# Patient Record
Sex: Male | Born: 1960 | Race: White | Hispanic: No | Marital: Married | Smoking: Never smoker
Health system: Southern US, Community
[De-identification: ages and names within clinical notes are randomized; demographics above are authoritative.]

## PROBLEM LIST (undated history)

## (undated) DIAGNOSIS — F32A Depression, unspecified: Secondary | ICD-10-CM

## (undated) DIAGNOSIS — I10 Essential (primary) hypertension: Secondary | ICD-10-CM

## (undated) DIAGNOSIS — E785 Hyperlipidemia, unspecified: Secondary | ICD-10-CM

## (undated) DIAGNOSIS — R011 Cardiac murmur, unspecified: Secondary | ICD-10-CM

## (undated) DIAGNOSIS — F329 Major depressive disorder, single episode, unspecified: Secondary | ICD-10-CM

## (undated) HISTORY — DX: Depression, unspecified: F32.A

## (undated) HISTORY — DX: Major depressive disorder, single episode, unspecified: F32.9

## (undated) HISTORY — DX: Hyperlipidemia, unspecified: E78.5

## (undated) HISTORY — DX: Essential (primary) hypertension: I10

---

## 2010-11-10 ENCOUNTER — Ambulatory Visit: Payer: Self-pay | Admitting: Internal Medicine

## 2010-12-07 ENCOUNTER — Ambulatory Visit: Payer: Self-pay | Admitting: Internal Medicine

## 2011-02-13 ENCOUNTER — Ambulatory Visit: Payer: Self-pay | Admitting: Family Medicine

## 2012-03-12 IMAGING — CR DG CHEST 2V
1 series · 3 of 3 positions shown · non-contrast
Comparison: none

REASON FOR EXAM: cough, congestion,
COMMENTS:

PROCEDURE:     MDR - MDR CHEST PA(OR AP) AND LATERAL  - November 10, 2010  [DATE]
RESULT:     The lungs are clear. The heart and pulmonary vessels are normal.
The bony and mediastinal structures are unremarkable. There is no effusion.
There is no pneumothorax or evidence of congestive failure.

[Series 1: view not recorded · 0.17mm/px · 3 of 3 slices shown]
[im 1/3]
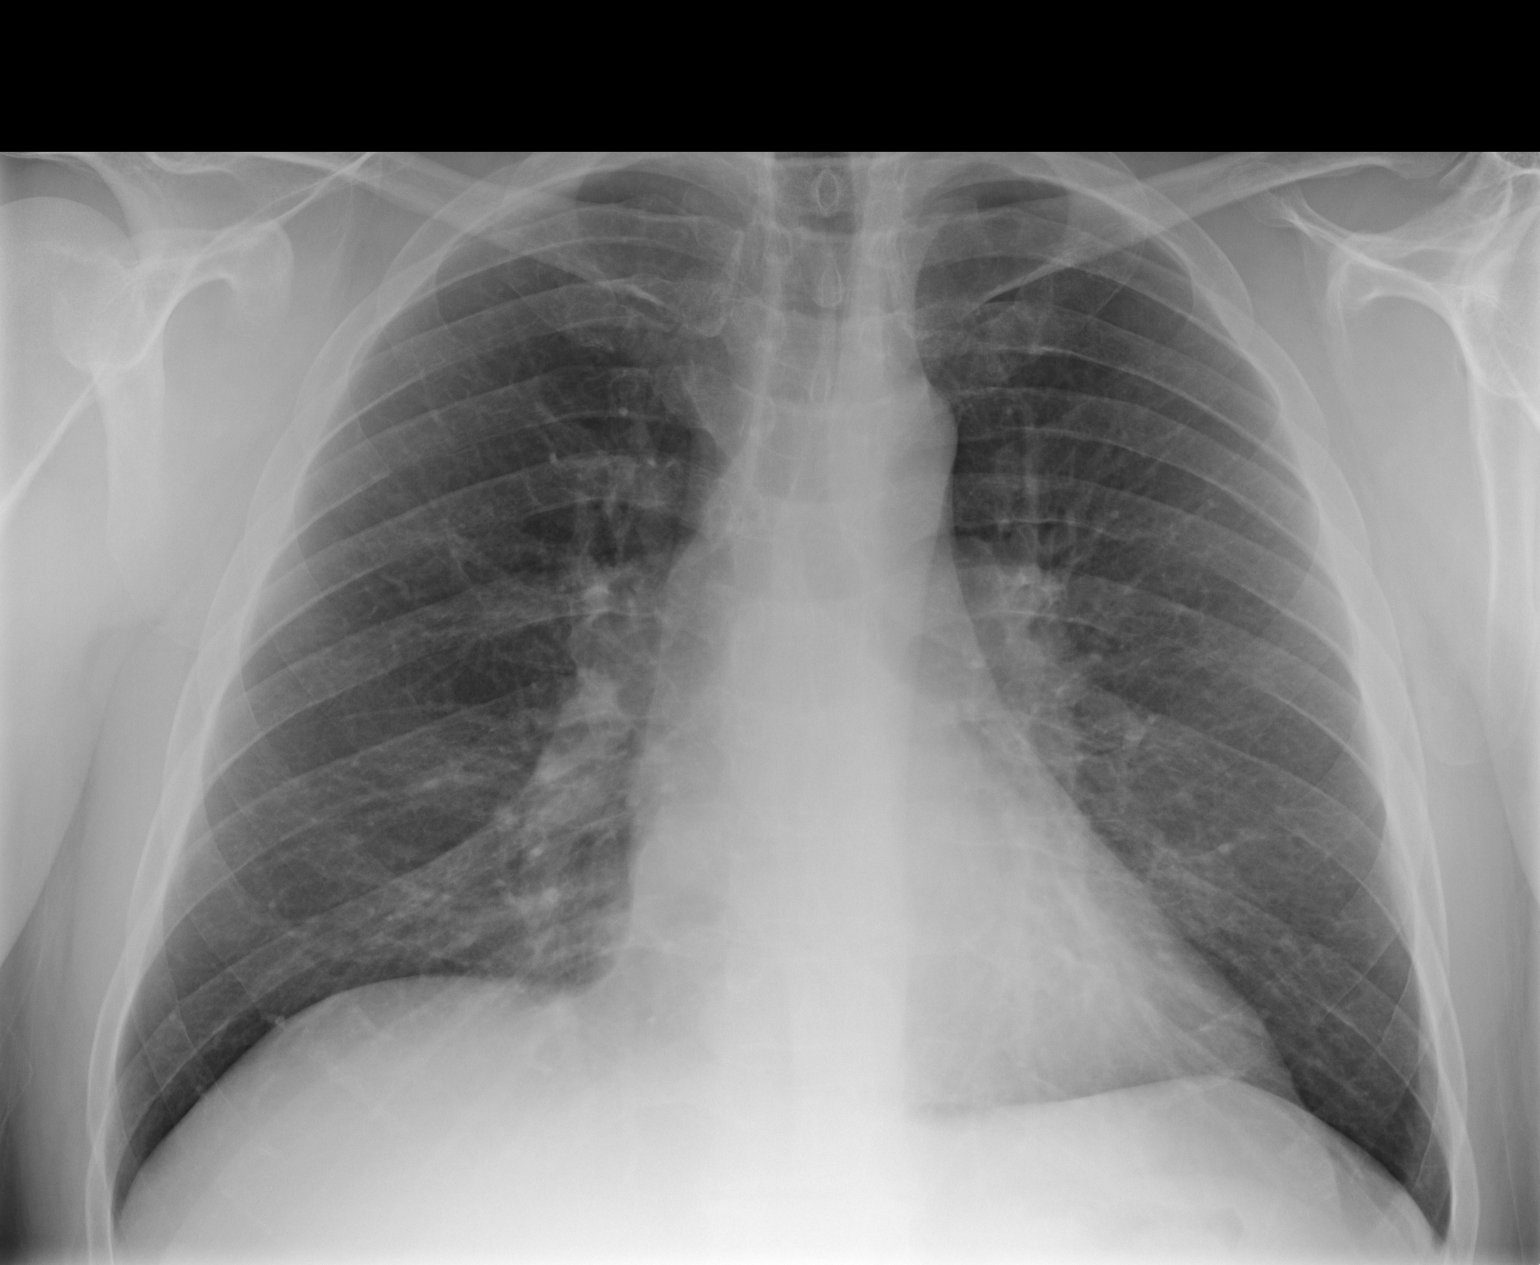
[im 2/3]
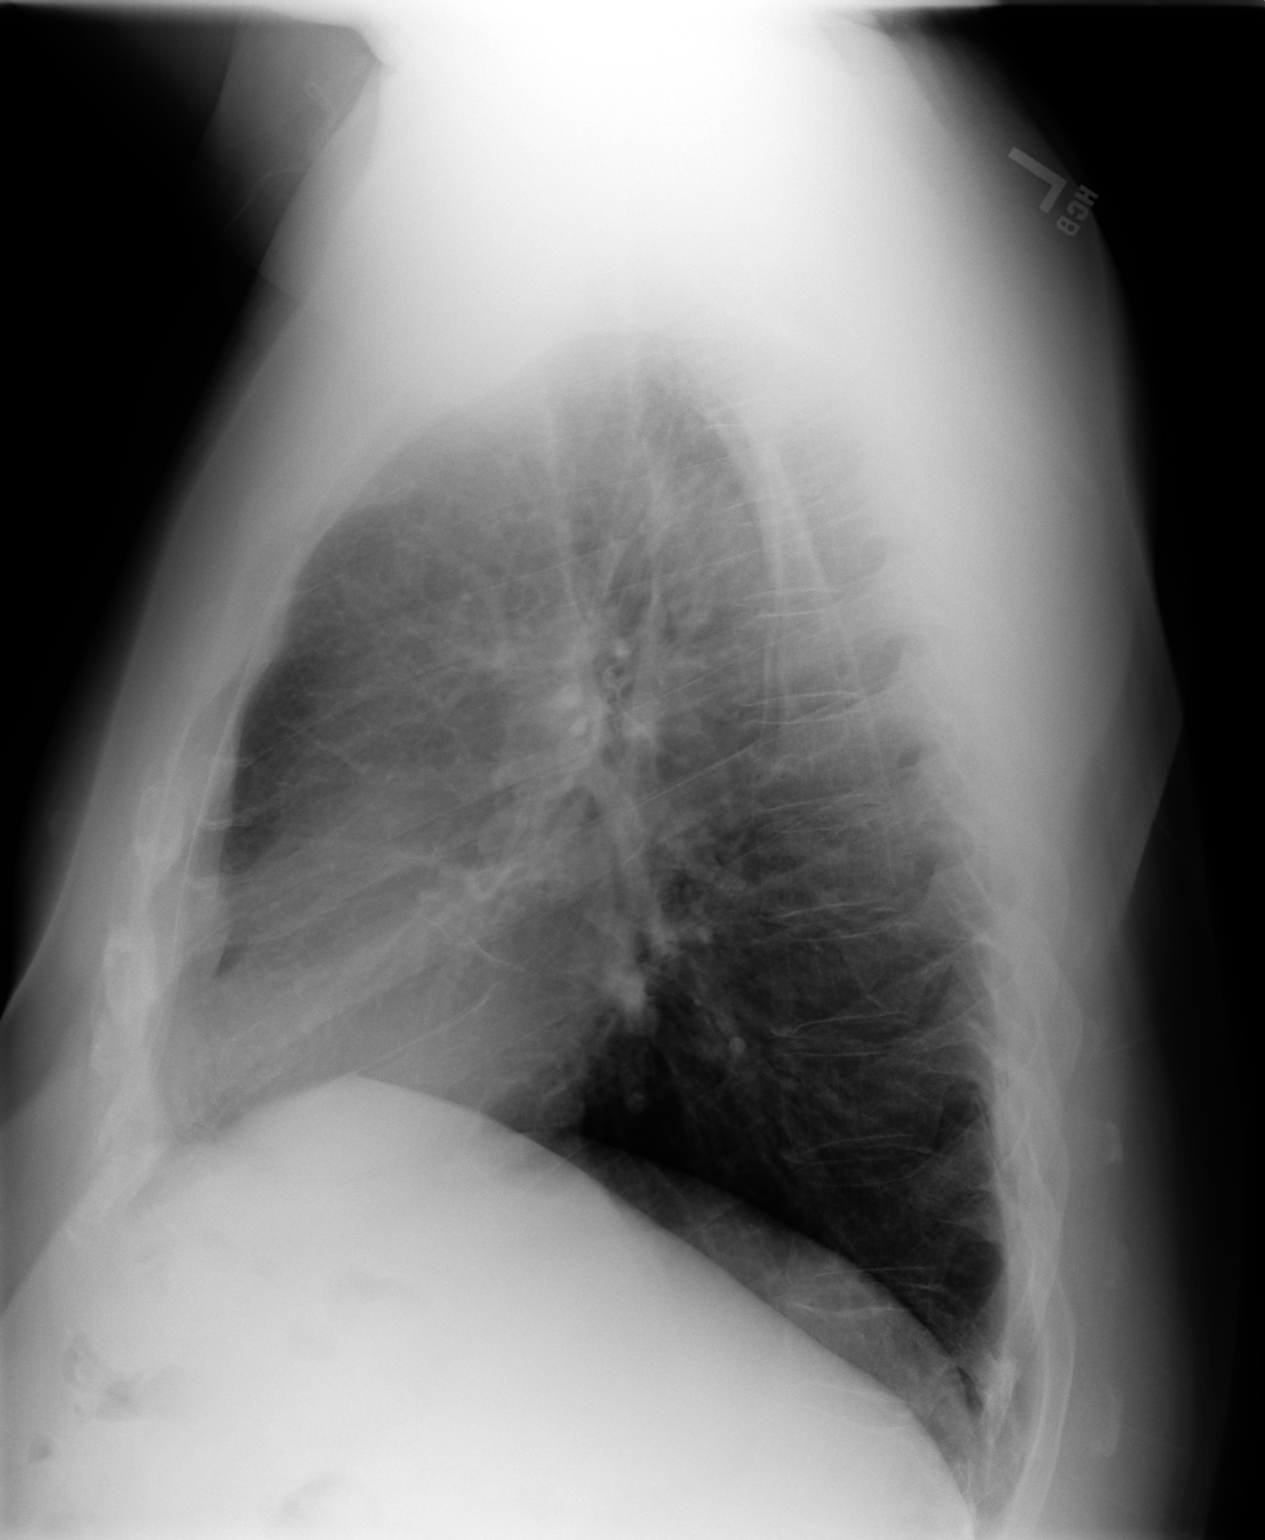
[im 3/3]
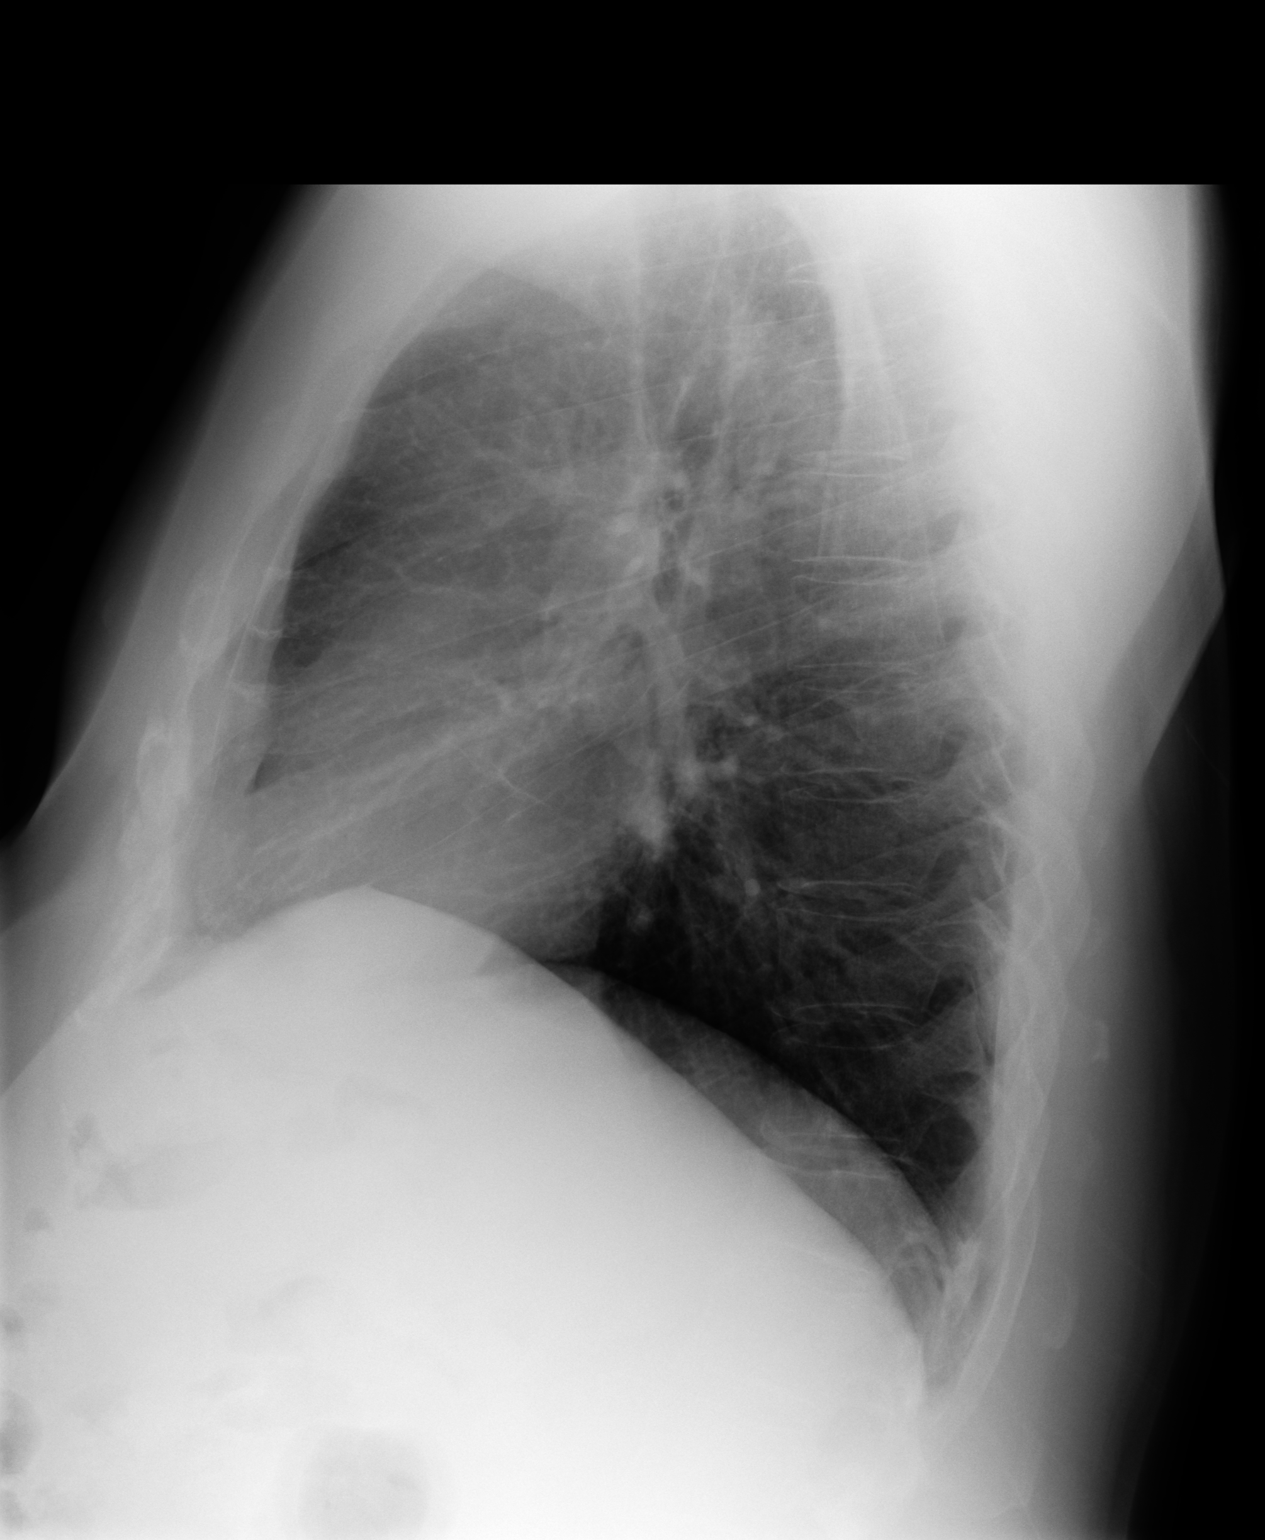

[3 of 3 positions shown; findings below may reference images not displayed]

IMPRESSION: No acute cardiopulmonary disease.

## 2012-03-15 ENCOUNTER — Ambulatory Visit: Payer: Self-pay

## 2012-03-15 LAB — OCCULT BLOOD X 1 CARD TO LAB, STOOL: Occult Blood, Feces: POSITIVE

## 2012-06-15 IMAGING — CR DG CHEST 2V
1 series · 2 of 2 positions shown · non-contrast
Comparison: none

REASON FOR EXAM: cough and wheezing
COMMENTS:   LMP: (Male)

[Series 1: view not recorded · 0.17mm/px · 2 of 2 slices shown]
[im 1/2]
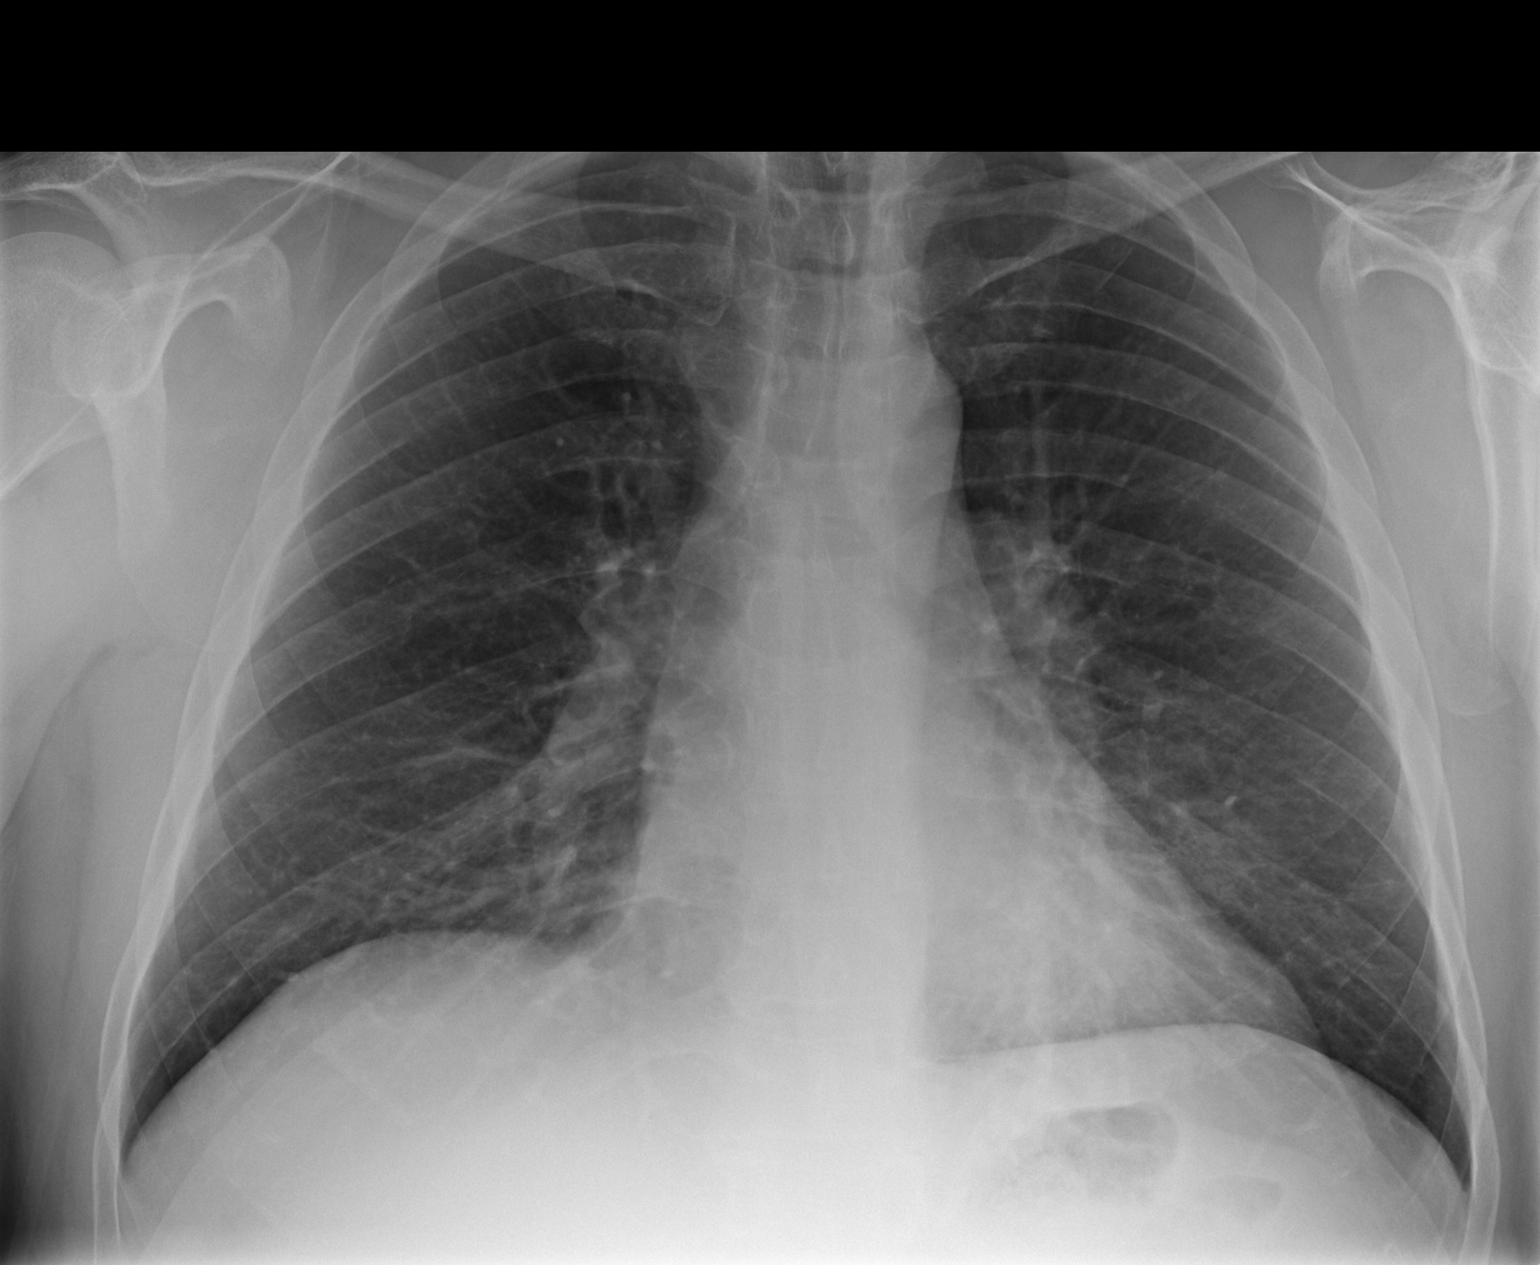
[im 2/2]
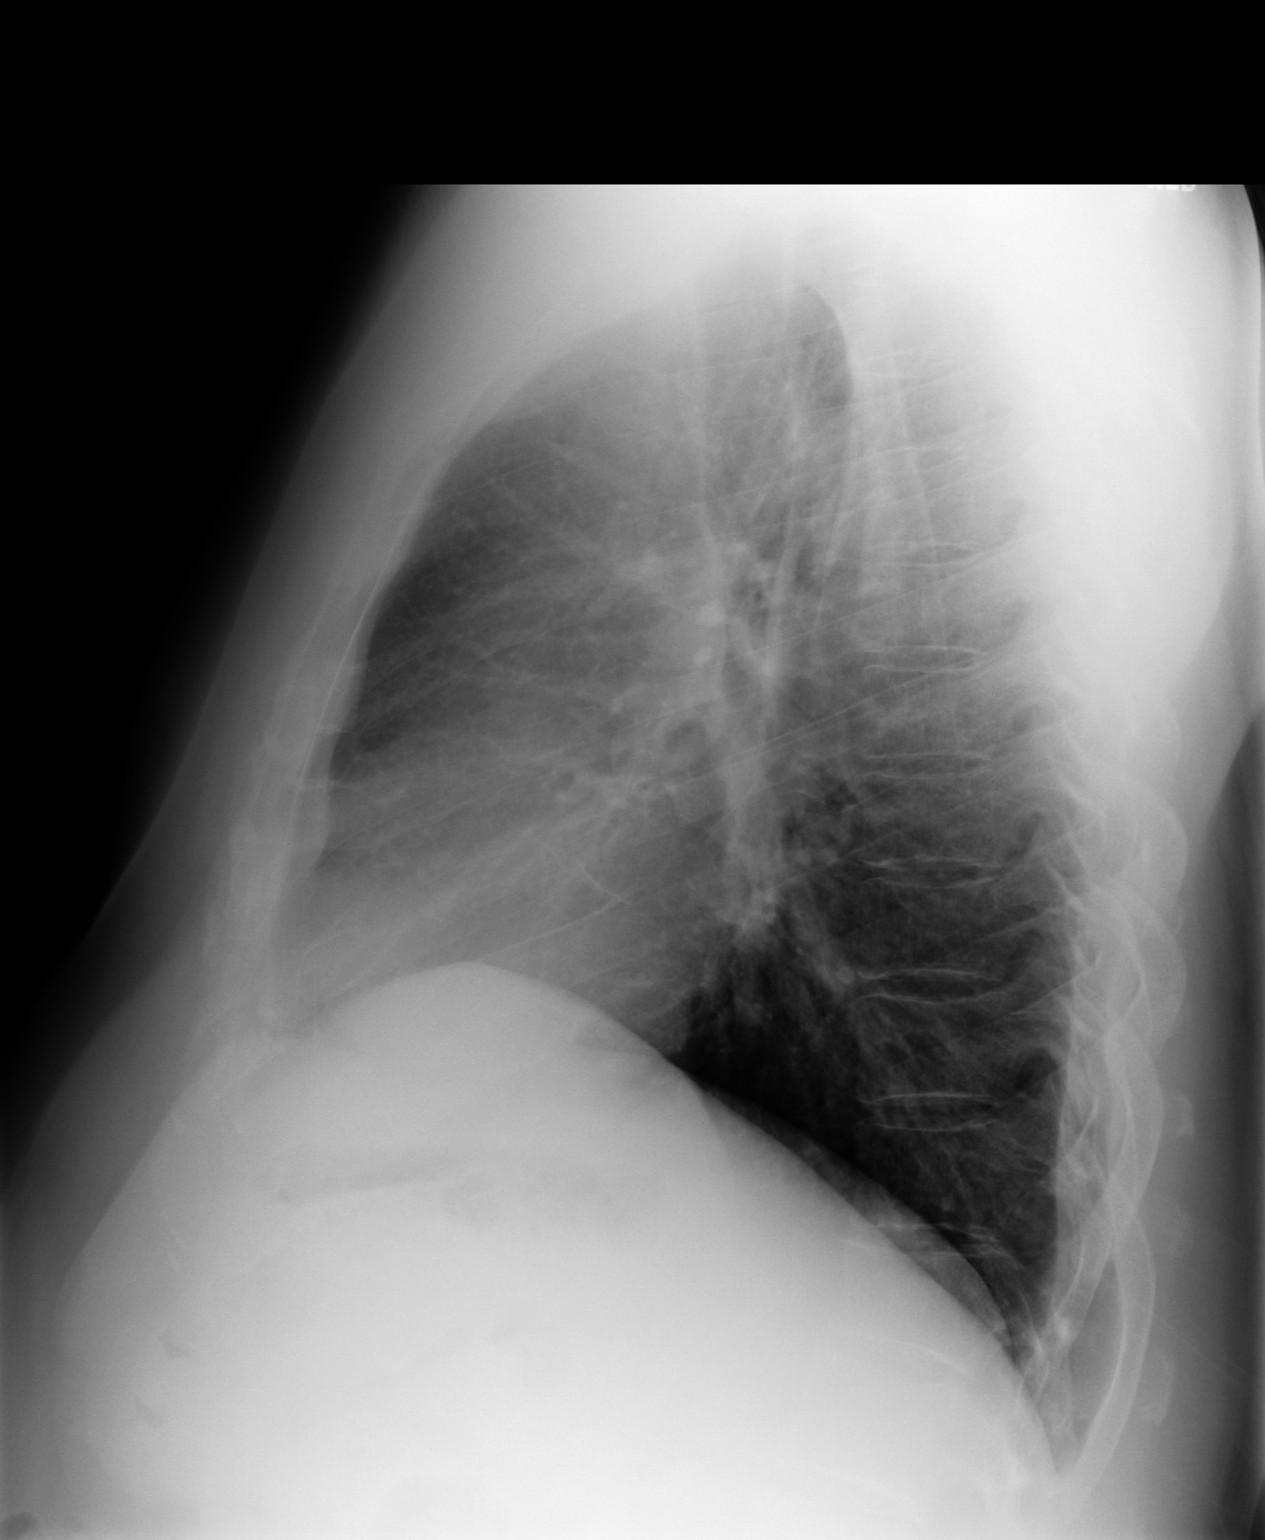

[2 of 2 positions shown; findings below may reference images not displayed]

PROCEDURE:     MDR - MDR CHEST PA(OR AP) AND LATERAL  - February 13, 2011  [DATE]

RESULT:     Comparison is made to the study of 11/10/2010.

The lungs are clear. The heart and pulmonary vessels are normal. The bony
and mediastinal structures are unremarkable. There is no effusion. There is
no pneumothorax or evidence of congestive failure.
IMPRESSION: No acute cardiopulmonary disease.

## 2013-03-10 ENCOUNTER — Ambulatory Visit: Payer: Self-pay

## 2013-03-10 LAB — DOT URINE DIP: Glucose,UR: NEGATIVE mg/dL (ref 0–75)

## 2013-09-19 ENCOUNTER — Ambulatory Visit: Payer: Self-pay | Admitting: Physician Assistant

## 2014-09-27 ENCOUNTER — Other Ambulatory Visit: Payer: Self-pay

## 2014-09-27 DIAGNOSIS — I10 Essential (primary) hypertension: Secondary | ICD-10-CM | POA: Insufficient documentation

## 2014-09-27 DIAGNOSIS — F419 Anxiety disorder, unspecified: Secondary | ICD-10-CM

## 2014-09-27 DIAGNOSIS — Z1211 Encounter for screening for malignant neoplasm of colon: Secondary | ICD-10-CM | POA: Insufficient documentation

## 2014-09-27 DIAGNOSIS — E785 Hyperlipidemia, unspecified: Secondary | ICD-10-CM | POA: Insufficient documentation

## 2014-09-27 DIAGNOSIS — F329 Major depressive disorder, single episode, unspecified: Secondary | ICD-10-CM | POA: Insufficient documentation

## 2014-09-28 ENCOUNTER — Ambulatory Visit (INDEPENDENT_AMBULATORY_CARE_PROVIDER_SITE_OTHER): Payer: BLUE CROSS/BLUE SHIELD | Admitting: Family Medicine

## 2014-09-28 ENCOUNTER — Encounter: Payer: Self-pay | Admitting: Family Medicine

## 2014-09-28 VITALS — BP 104/62 | HR 64 | Ht 73.0 in | Wt 286.0 lb

## 2014-09-28 DIAGNOSIS — I10 Essential (primary) hypertension: Secondary | ICD-10-CM

## 2014-09-28 DIAGNOSIS — E785 Hyperlipidemia, unspecified: Secondary | ICD-10-CM | POA: Diagnosis not present

## 2014-09-28 DIAGNOSIS — F329 Major depressive disorder, single episode, unspecified: Secondary | ICD-10-CM

## 2014-09-28 DIAGNOSIS — S46811A Strain of other muscles, fascia and tendons at shoulder and upper arm level, right arm, initial encounter: Secondary | ICD-10-CM

## 2014-09-28 DIAGNOSIS — F419 Anxiety disorder, unspecified: Secondary | ICD-10-CM

## 2014-09-28 DIAGNOSIS — F32A Depression, unspecified: Secondary | ICD-10-CM

## 2014-09-28 DIAGNOSIS — F418 Other specified anxiety disorders: Secondary | ICD-10-CM

## 2014-09-28 MED ORDER — ESCITALOPRAM OXALATE 20 MG PO TABS: 20.0000 mg | ORAL_TABLET | Freq: Every day | ORAL | Status: DC

## 2014-09-28 MED ORDER — LISINOPRIL 30 MG PO TABS: 30.0000 mg | ORAL_TABLET | Freq: Every day | ORAL | Status: DC

## 2014-09-28 MED ORDER — GEMFIBROZIL 600 MG PO TABS: 600.0000 mg | ORAL_TABLET | Freq: Two times a day (BID) | ORAL | Status: DC

## 2014-09-28 MED ORDER — HYDROCHLOROTHIAZIDE 25 MG PO TABS: 25.0000 mg | ORAL_TABLET | Freq: Every day | ORAL | Status: DC

## 2014-09-28 NOTE — Progress Notes (Signed)
Name: James Bishop   MRN: [id number]    DOB: October 07, 1960   Date:09/28/2014       Progress Note  Subjective  Chief Complaint  Chief Complaint  Patient presents with  . Depression  . Hypertension  . Hyperlipidemia  . Shoulder Pain    hurts to raise R) arm    Hypertension This is a chronic problem. The current episode started more than 1 year ago. The problem has been gradually improving since onset. The problem is controlled. Associated symptoms include headaches and PND. Pertinent negatives include no anxiety, blurred vision, chest pain, malaise/fatigue, neck pain, palpitations or shortness of breath. There are no associated agents to hypertension. There are no known risk factors for coronary artery disease. Past treatments include nothing. The current treatment provides moderate improvement. There are no compliance problems.  There is no history of angina, kidney disease, CAD/MI, CVA, heart failure, left ventricular hypertrophy, PVD or retinopathy. There is no history of chronic renal disease.  Hyperlipidemia This is a chronic problem. The current episode started more than 1 year ago. The problem is controlled. Recent lipid tests were reviewed and are normal. Exacerbating diseases include obesity. He has no history of chronic renal disease or diabetes. There are no known factors aggravating his hyperlipidemia. Pertinent negatives include no chest pain, focal sensory loss, focal weakness, leg pain, myalgias or shortness of breath. Current antihyperlipidemic treatment includes fibric acid derivatives. The current treatment provides mild improvement of lipids. There are no compliance problems.   Shoulder Pain  The pain is present in the right shoulder. This is a new problem. The current episode started more than 1 month ago. There has been no history of extremity trauma. The problem occurs daily. The problem has been gradually worsening. The quality of the pain is described as aching. The pain is  at a severity of 7/10. The pain is moderate. Pertinent negatives include no fever, joint locking or tingling. The symptoms are aggravated by activity. Family history does not include gout or rheumatoid arthritis. There is no history of diabetes.  Mental Health Problem The primary symptoms include dysphoric mood. The current episode started more than 1 month ago. This is a recurrent problem.  The degree of incapacity that he is experiencing as a consequence of his illness is mild. Additional symptoms of the illness include headaches. Additional symptoms of the illness do not include no anhedonia, no insomnia, no agitation, no euphoric mood or no abdominal pain. He does not admit to suicidal ideas. He does not have a plan to commit suicide. He does not contemplate harming himself. He does not contemplate injuring another person. He has not already  injured another person.    No problem-specific assessment & plan notes found for this encounter.   Past Medical History  Diagnosis Date  . Hyperlipidemia   . Hypertension   . Depression     History reviewed. No pertinent past surgical history.  Family History  Problem Relation Age of Onset  . Heart disease Maternal Grandmother   . Heart disease Maternal Grandfather     History   Social History  . Marital Status: Married    Spouse Name: N/A  . Number of Children: N/A  . Years of Education: N/A   Occupational History  . Not on file.   Social History Main Topics  . Smoking status: Former Research scientist (life sciences)  . Smokeless tobacco: Not on file  . Alcohol Use: 0.0 oz/week    0 Standard drinks or equivalent per  week  . Drug Use: No  . Sexual Activity: Yes   Other Topics Concern  . Not on file   Social History Narrative  . No narrative on file    Allergies  Allergen Reactions  . Bee Venom   . Penicillins      Review of Systems  Constitutional: Negative for fever, chills, weight loss and malaise/fatigue.  HENT: Negative for ear discharge,  ear pain and sore throat.   Eyes: Negative for blurred vision.  Respiratory: Negative for cough, sputum production, shortness of breath and wheezing.   Cardiovascular: Positive for PND. Negative for chest pain, palpitations and leg swelling.  Gastrointestinal: Negative for heartburn, nausea, abdominal pain, diarrhea, constipation, blood in stool and melena.  Genitourinary: Negative for dysuria, urgency, frequency and hematuria.  Musculoskeletal: Negative for myalgias, back pain, joint pain and neck pain.  Skin: Negative for rash.  Neurological: Positive for headaches. Negative for dizziness, tingling, sensory change and focal weakness.  Endo/Heme/Allergies: Negative for environmental allergies and polydipsia. Does not bruise/bleed easily.  Psychiatric/Behavioral: Positive for dysphoric mood. Negative for depression, suicidal ideas and agitation. The patient is not nervous/anxious and does not have insomnia.      Objective  Filed Vitals:   09/28/14 1055  BP: 104/62  Pulse: 64  Height: 6\' 1"  (1.854 m)  Weight: 286 lb (129.729 kg)    Physical Exam  Constitutional: He is oriented to person, place, and time and well-developed, well-nourished, and in no distress. No distress.  HENT:  Head: Normocephalic and atraumatic.  Right Ear: External ear normal.  Left Ear: External ear normal.  Nose: Nose normal.  Mouth/Throat: Oropharynx is clear and moist. No oropharyngeal exudate.  Eyes: Conjunctivae and EOM are normal. Pupils are equal, round, and reactive to light. Right eye exhibits no discharge. Left eye exhibits no discharge.  Neck: Normal range of motion. Neck supple. No thyromegaly present.  Cardiovascular: Normal rate, regular rhythm, normal heart sounds and intact distal pulses.   No murmur heard. Pulmonary/Chest: Effort normal and breath sounds normal. He has no wheezes. He has no rales. He exhibits no tenderness.  Abdominal: Soft. Bowel sounds are normal. There is no tenderness.   Musculoskeletal:       Right shoulder: He exhibits decreased range of motion and tenderness.  Lymphadenopathy:    He has no cervical adenopathy.  Neurological: He is alert and oriented to person, place, and time. He has normal reflexes. He displays normal reflexes. No cranial nerve deficit. He exhibits normal muscle tone.  Skin: Skin is warm and dry. He is not diaphoretic.  Psychiatric: Mood and affect normal.      No results found for this or any previous visit (from the past 2160 hour(s)).   Assessment & Plan  Problem List Items Addressed This Visit      Cardiovascular and Mediastinum   Hypertension - Primary   Relevant Medications   gemfibrozil (LOPID) 600 MG tablet   hydrochlorothiazide (HYDRODIURIL) 25 MG tablet   lisinopril (PRINIVIL,ZESTRIL) 30 MG tablet     Other   Anxiety and depression   Relevant Medications   escitalopram (LEXAPRO) 20 MG tablet   Dyslipidemia   Relevant Medications   gemfibrozil (LOPID) 600 MG tablet    Other Visit Diagnoses    Supraspinatus sprain, right, initial encounter        Relevant Orders    Ambulatory referral to Orthopedic Surgery         Dr. [facility]  09/28/2014    

## 2014-09-29 ENCOUNTER — Ambulatory Visit: Payer: Self-pay | Admitting: Family Medicine

## 2014-09-29 LAB — RENAL FUNCTION PANEL
Albumin: 4.6 g/dL (ref 3.5–5.5)
BUN/Creatinine Ratio: 17 (ref 9–20)
BUN: 15 mg/dL (ref 6–24)
CO2: 26 mmol/L (ref 18–29)
Calcium: 9.4 mg/dL (ref 8.7–10.2)
Chloride: 98 mmol/L (ref 97–108)
Creatinine, Ser: 0.86 mg/dL (ref 0.76–1.27)
GFR calc Af Amer: 115 mL/min/{1.73_m2} (ref >59)
GFR calc non Af Amer: 100 mL/min/{1.73_m2} (ref >59)
Glucose: 92 mg/dL (ref 65–99)
Phosphorus: 3.5 mg/dL (ref 2.5–4.5)
Potassium: 4.8 mmol/L (ref 3.5–5.2)
Sodium: 139 mmol/L (ref 134–144)

## 2014-09-29 LAB — LIPID PANEL
CHOLESTEROL TOTAL: 222 mg/dL — AB (ref 100–199)
Chol/HDL Ratio: 5.4 ratio units — ABNORMAL HIGH (ref 0.0–5.0)
HDL: 41 mg/dL (ref >39)
LDL Calculated: 132 mg/dL — ABNORMAL HIGH (ref 0–99)
TRIGLYCERIDES: 243 mg/dL — AB (ref 0–149)
VLDL Cholesterol Cal: 49 mg/dL — ABNORMAL HIGH (ref 5–40)

## 2014-11-12 ENCOUNTER — Other Ambulatory Visit: Payer: Self-pay | Admitting: Family Medicine

## 2014-11-12 DIAGNOSIS — F32A Depression, unspecified: Secondary | ICD-10-CM

## 2014-11-12 DIAGNOSIS — F329 Major depressive disorder, single episode, unspecified: Secondary | ICD-10-CM

## 2014-11-12 DIAGNOSIS — I10 Essential (primary) hypertension: Secondary | ICD-10-CM

## 2015-04-10 ENCOUNTER — Other Ambulatory Visit: Payer: Self-pay | Admitting: Family Medicine

## 2015-05-23 ENCOUNTER — Other Ambulatory Visit: Payer: Self-pay

## 2015-05-23 DIAGNOSIS — I10 Essential (primary) hypertension: Secondary | ICD-10-CM

## 2015-05-23 MED ORDER — HYDROCHLOROTHIAZIDE 25 MG PO TABS: 25.0000 mg | ORAL_TABLET | Freq: Every day | ORAL | Status: DC

## 2015-05-23 MED ORDER — LISINOPRIL 30 MG PO TABS: 30.0000 mg | ORAL_TABLET | Freq: Every day | ORAL | Status: DC

## 2015-05-23 MED ORDER — GEMFIBROZIL 600 MG PO TABS: 600.0000 mg | ORAL_TABLET | Freq: Two times a day (BID) | ORAL | Status: DC

## 2015-05-25 ENCOUNTER — Ambulatory Visit: Payer: BLUE CROSS/BLUE SHIELD | Admitting: Family Medicine

## 2015-06-08 ENCOUNTER — Encounter: Payer: Self-pay | Admitting: Family Medicine

## 2015-06-08 ENCOUNTER — Ambulatory Visit (INDEPENDENT_AMBULATORY_CARE_PROVIDER_SITE_OTHER): Payer: BLUE CROSS/BLUE SHIELD | Admitting: Family Medicine

## 2015-06-08 VITALS — BP 120/80 | HR 78 | Ht 73.0 in | Wt 280.0 lb

## 2015-06-08 DIAGNOSIS — F418 Other specified anxiety disorders: Secondary | ICD-10-CM

## 2015-06-08 DIAGNOSIS — Z23 Encounter for immunization: Secondary | ICD-10-CM | POA: Diagnosis not present

## 2015-06-08 DIAGNOSIS — E785 Hyperlipidemia, unspecified: Secondary | ICD-10-CM

## 2015-06-08 DIAGNOSIS — F32A Depression, unspecified: Secondary | ICD-10-CM

## 2015-06-08 DIAGNOSIS — F419 Anxiety disorder, unspecified: Secondary | ICD-10-CM

## 2015-06-08 DIAGNOSIS — I10 Essential (primary) hypertension: Secondary | ICD-10-CM | POA: Diagnosis not present

## 2015-06-08 DIAGNOSIS — F329 Major depressive disorder, single episode, unspecified: Secondary | ICD-10-CM

## 2015-06-08 MED ORDER — ESCITALOPRAM OXALATE 20 MG PO TABS: 20.0000 mg | ORAL_TABLET | Freq: Every day | ORAL | Status: DC

## 2015-06-08 MED ORDER — ESCITALOPRAM OXALATE 10 MG PO TABS: 10.0000 mg | ORAL_TABLET | Freq: Every day | ORAL | Status: DC

## 2015-06-08 MED ORDER — HYDROCHLOROTHIAZIDE 25 MG PO TABS: 25.0000 mg | ORAL_TABLET | Freq: Every day | ORAL | Status: DC

## 2015-06-08 MED ORDER — LISINOPRIL 30 MG PO TABS: 30.0000 mg | ORAL_TABLET | Freq: Every day | ORAL | Status: DC

## 2015-06-08 MED ORDER — GEMFIBROZIL 600 MG PO TABS: 600.0000 mg | ORAL_TABLET | Freq: Two times a day (BID) | ORAL | Status: DC

## 2015-06-08 NOTE — Progress Notes (Signed)
Name: James Bishop   MRN: [id number]    DOB: 12-13-1960   Date:06/08/2015       Progress Note  Subjective  Chief Complaint  Chief Complaint  Patient presents with  . Hypertension  . Hyperlipidemia  . Depression    discuss med    Hypertension This is a chronic problem. The current episode started more than 1 year ago. The problem has been gradually improving since onset. The problem is controlled. Pertinent negatives include no anxiety, blurred vision, chest pain, headaches, malaise/fatigue, neck pain, orthopnea, palpitations, peripheral edema, PND, shortness of breath or sweats. There are no associated agents to hypertension. There are no known risk factors for coronary artery disease. Past treatments include ACE inhibitors and diuretics. The current treatment provides moderate improvement. There are no compliance problems.  There is no history of angina, kidney disease, CAD/MI, CVA, heart failure, left ventricular hypertrophy, PVD, renovascular disease or retinopathy. There is no history of chronic renal disease or a hypertension causing med.  Hyperlipidemia This is a chronic problem. The current episode started more than 1 year ago. The problem is controlled. Recent lipid tests were reviewed and are normal. He has no history of chronic renal disease, diabetes, hypothyroidism, liver disease, obesity or nephrotic syndrome. Pertinent negatives include no chest pain, focal weakness, myalgias or shortness of breath. Current antihyperlipidemic treatment includes statins. The current treatment provides moderate improvement of lipids. There are no compliance problems.   Depression        This is a chronic problem.  The current episode started more than 1 year ago.   The onset quality is gradual.   Associated symptoms include no decreased concentration, no fatigue, no helplessness, no hopelessness, does not have insomnia, not irritable, no restlessness, no decreased interest, no appetite change, no  body aches, no myalgias, no headaches, no indigestion, not sad and no suicidal ideas.  Past treatments include SSRIs - Selective serotonin reuptake inhibitors.  Compliance with treatment is good.   Pertinent negatives include no hypothyroidism and no anxiety.   No problem-specific assessment & plan notes found for this encounter.   Past Medical History  Diagnosis Date  . Hyperlipidemia   . Hypertension   . Depression     History reviewed. No pertinent past surgical history.  Family History  Problem Relation Age of Onset  . Heart disease Maternal Grandmother   . Heart disease Maternal Grandfather     Social History   Social History  . Marital Status: Married    Spouse Name: N/A  . Number of Children: N/A  . Years of Education: N/A   Occupational History  . Not on file.   Social History Main Topics  . Smoking status: Former Research scientist (life sciences)  . Smokeless tobacco: Not on file  . Alcohol Use: 0.0 oz/week    0 Standard drinks or equivalent per week  . Drug Use: No  . Sexual Activity: Yes   Other Topics Concern  . Not on file   Social History Narrative    Allergies  Allergen Reactions  . Bee Venom   . Penicillins      Review of Systems  Constitutional: Negative for fever, chills, weight loss, malaise/fatigue, appetite change and fatigue.  HENT: Negative for ear discharge, ear pain and sore throat.   Eyes: Negative for blurred vision.  Respiratory: Negative for cough, sputum production, shortness of breath and wheezing.   Cardiovascular: Negative for chest pain, palpitations, orthopnea, leg swelling and PND.  Gastrointestinal: Negative for heartburn, nausea,  abdominal pain, diarrhea, constipation, blood in stool and melena.  Genitourinary: Negative for dysuria, urgency, frequency and hematuria.  Musculoskeletal: Negative for myalgias, back pain, joint pain and neck pain.  Skin: Negative for rash.  Neurological: Negative for dizziness, tingling, sensory change, focal  weakness and headaches.  Endo/Heme/Allergies: Negative for environmental allergies and polydipsia. Does not bruise/bleed easily.  Psychiatric/Behavioral: Positive for depression. Negative for suicidal ideas and decreased concentration. The patient is not nervous/anxious and does not have insomnia.      Objective  Filed Vitals:   06/08/15 1344  BP: 120/80  Pulse: 78  Height: 6\' 1"  (1.854 m)  Weight: 280 lb (127.007 kg)    Physical Exam  Constitutional: He is oriented to person, place, and time and well-developed, well-nourished, and in no distress. He is not irritable.  HENT:  Head: Normocephalic.  Right Ear: External ear normal.  Left Ear: External ear normal.  Nose: Nose normal.  Mouth/Throat: Oropharynx is clear and moist.  Eyes: Conjunctivae and EOM are normal. Pupils are equal, round, and reactive to light. Right eye exhibits no discharge. Left eye exhibits no discharge. No scleral icterus.  Neck: Normal range of motion. Neck supple. No JVD present. No tracheal deviation present. No thyromegaly present.  Cardiovascular: Normal rate, regular rhythm, normal heart sounds and intact distal pulses.  Exam reveals no gallop and no friction rub.   No murmur heard. Pulmonary/Chest: Breath sounds normal. No respiratory distress. He has no wheezes. He has no rales.  Abdominal: Soft. Bowel sounds are normal. He exhibits no mass. There is no hepatosplenomegaly. There is no tenderness. There is no rebound, no guarding and no CVA tenderness.  Musculoskeletal: Normal range of motion. He exhibits no edema or tenderness.  Lymphadenopathy:    He has no cervical adenopathy.  Neurological: He is alert and oriented to person, place, and time. He has normal sensation, normal strength, normal reflexes and intact cranial nerves. No cranial nerve deficit.  Skin: Skin is warm. No rash noted.  Psychiatric: Mood and affect normal.      Assessment & Plan  Problem List Items Addressed This Visit       Cardiovascular and Mediastinum   Hypertension - Primary   Relevant Medications   gemfibrozil (LOPID) 600 MG tablet   hydrochlorothiazide (HYDRODIURIL) 25 MG tablet   lisinopril (PRINIVIL,ZESTRIL) 30 MG tablet   Other Relevant Orders   Renal Function Panel     Other   Anxiety and depression   Relevant Medications   escitalopram (LEXAPRO) 20 MG tablet   escitalopram (LEXAPRO) 10 MG tablet   Dyslipidemia   Relevant Medications   gemfibrozil (LOPID) 600 MG tablet   Other Relevant Orders   Lipid Profile    Other Visit Diagnoses    Depression        if scored take one half with 20 mg    Relevant Medications    escitalopram (LEXAPRO) 20 MG tablet    escitalopram (LEXAPRO) 10 MG tablet    Need for Tdap vaccination        Relevant Orders    Tdap vaccine greater than or equal to 7yo IM (Completed)         Dr. Erick [facility]  06/08/2015

## 2015-06-09 LAB — RENAL FUNCTION PANEL
Albumin: 4.5 g/dL (ref 3.5–5.5)
BUN/Creatinine Ratio: 15 (ref 9–20)
BUN: 13 mg/dL (ref 6–24)
CALCIUM: 9 mg/dL (ref 8.7–10.2)
CHLORIDE: 100 mmol/L (ref 96–106)
CO2: 23 mmol/L (ref 18–29)
CREATININE: 0.86 mg/dL (ref 0.76–1.27)
GFR calc Af Amer: 114 mL/min/{1.73_m2} (ref >59)
GFR calc non Af Amer: 99 mL/min/{1.73_m2} (ref >59)
Glucose: 71 mg/dL (ref 65–99)
PHOSPHORUS: 3.1 mg/dL (ref 2.5–4.5)
Potassium: 4.1 mmol/L (ref 3.5–5.2)
SODIUM: 140 mmol/L (ref 134–144)

## 2015-06-09 LAB — LIPID PANEL
CHOLESTEROL TOTAL: 200 mg/dL — AB (ref 100–199)
Chol/HDL Ratio: 5 ratio units (ref 0.0–5.0)
HDL: 40 mg/dL (ref >39)
LDL CALC: 130 mg/dL — AB (ref 0–99)
TRIGLYCERIDES: 152 mg/dL — AB (ref 0–149)
VLDL Cholesterol Cal: 30 mg/dL (ref 5–40)

## 2015-06-22 ENCOUNTER — Other Ambulatory Visit: Payer: Self-pay | Admitting: Family Medicine

## 2015-07-06 ENCOUNTER — Ambulatory Visit: Payer: BLUE CROSS/BLUE SHIELD | Admitting: Family Medicine

## 2015-08-04 ENCOUNTER — Other Ambulatory Visit: Payer: Self-pay | Admitting: Family Medicine

## 2015-09-06 ENCOUNTER — Other Ambulatory Visit: Payer: Self-pay

## 2015-09-24 ENCOUNTER — Ambulatory Visit
Admission: EM | Admit: 2015-09-24 | Discharge: 2015-09-24 | Disposition: A | Discharge status: Home / Self Care | Payer: Self-pay

## 2015-10-07 ENCOUNTER — Other Ambulatory Visit: Payer: Self-pay | Admitting: Family Medicine

## 2015-12-06 ENCOUNTER — Other Ambulatory Visit: Payer: Self-pay

## 2015-12-18 ENCOUNTER — Other Ambulatory Visit: Payer: Self-pay

## 2015-12-18 DIAGNOSIS — E785 Hyperlipidemia, unspecified: Secondary | ICD-10-CM

## 2015-12-18 MED ORDER — GEMFIBROZIL 600 MG PO TABS: 600.0000 mg | ORAL_TABLET | Freq: Two times a day (BID) | ORAL | 0 refills | Status: DC

## 2016-01-07 ENCOUNTER — Encounter: Payer: Self-pay | Admitting: Family Medicine

## 2016-01-07 ENCOUNTER — Ambulatory Visit (INDEPENDENT_AMBULATORY_CARE_PROVIDER_SITE_OTHER): Payer: Self-pay | Admitting: Family Medicine

## 2016-01-07 VITALS — BP 130/80 | HR 80 | Ht 73.0 in | Wt 292.0 lb

## 2016-01-07 DIAGNOSIS — E669 Obesity, unspecified: Secondary | ICD-10-CM

## 2016-01-07 DIAGNOSIS — F419 Anxiety disorder, unspecified: Secondary | ICD-10-CM

## 2016-01-07 DIAGNOSIS — F418 Other specified anxiety disorders: Secondary | ICD-10-CM

## 2016-01-07 DIAGNOSIS — I10 Essential (primary) hypertension: Secondary | ICD-10-CM

## 2016-01-07 DIAGNOSIS — F329 Major depressive disorder, single episode, unspecified: Secondary | ICD-10-CM

## 2016-01-07 DIAGNOSIS — E785 Hyperlipidemia, unspecified: Secondary | ICD-10-CM

## 2016-01-07 DIAGNOSIS — F32A Depression, unspecified: Secondary | ICD-10-CM

## 2016-01-07 MED ORDER — ESCITALOPRAM OXALATE 20 MG PO TABS: 20.0000 mg | ORAL_TABLET | Freq: Every day | ORAL | 5 refills | Status: DC

## 2016-01-07 MED ORDER — HYDROCHLOROTHIAZIDE 25 MG PO TABS: 25.0000 mg | ORAL_TABLET | Freq: Every day | ORAL | 5 refills | Status: DC

## 2016-01-07 MED ORDER — GEMFIBROZIL 600 MG PO TABS: 600.0000 mg | ORAL_TABLET | Freq: Two times a day (BID) | ORAL | 5 refills | Status: DC

## 2016-01-07 MED ORDER — LISINOPRIL 30 MG PO TABS: 30.0000 mg | ORAL_TABLET | Freq: Every day | ORAL | 5 refills | Status: DC

## 2016-01-07 NOTE — Patient Instructions (Signed)

## 2016-01-07 NOTE — Progress Notes (Signed)
Name: James Bishop   MRN: [id number]    DOB: December 05, 1960   Date:01/07/2016       Progress Note  Subjective  Chief Complaint  Chief Complaint  Patient presents with  . Hyperlipidemia  . Hypertension  . Depression    Hypertension  This is a recurrent problem. The current episode started more than 1 year ago. The problem has been gradually improving since onset. The problem is controlled. Pertinent negatives include no anxiety, blurred vision, chest pain, headaches, malaise/fatigue, neck pain, orthopnea, palpitations, peripheral edema, PND, shortness of breath or sweats. There are no associated agents to hypertension. There are no known risk factors for coronary artery disease. Past treatments include ACE inhibitors and diuretics. The current treatment provides mild improvement. There are no compliance problems.  There is no history of angina, kidney disease, CAD/MI, CVA, heart failure, left ventricular hypertrophy, PVD, renovascular disease or retinopathy. There is no history of chronic renal disease or a hypertension causing med.  Hyperlipidemia  This is a chronic problem. The problem is controlled. Recent lipid tests were reviewed and are normal. He has no history of chronic renal disease. There are no known factors aggravating his hyperlipidemia. Pertinent negatives include no chest pain, focal sensory loss, focal weakness, leg pain, myalgias or shortness of breath. Current antihyperlipidemic treatment includes statins. The current treatment provides mild improvement of lipids. There are no compliance problems.  Risk factors for coronary artery disease include male sex, hypertension, dyslipidemia and obesity.  Depression       The patient presents with depression.  This is a recurrent problem.  The current episode started more than 1 year ago.   The onset quality is gradual.   The problem occurs intermittently.  The problem has been waxing and waning since onset.  Associated symptoms include  sad.  Associated symptoms include no decreased concentration, no fatigue, no helplessness, no hopelessness, does not have insomnia, not irritable, no restlessness, no decreased interest, no appetite change, no body aches, no myalgias, no headaches, no indigestion and no suicidal ideas.     The symptoms are aggravated by medication.  Past treatments include SSRIs - Selective serotonin reuptake inhibitors.  Compliance with treatment is good.  Previous treatment provided mild relief.  Past medical history includes depression.     Pertinent negatives include no anxiety.   No problem-specific Assessment & Plan notes found for this encounter.   Past Medical History:  Diagnosis Date  . Depression   . Hyperlipidemia   . Hypertension     History reviewed. No pertinent surgical history.  Family History  Problem Relation Age of Onset  . Heart disease Maternal Grandmother   . Heart disease Maternal Grandfather     Social History   Social History  . Marital status: Married    Spouse name: N/A  . Number of children: N/A  . Years of education: N/A   Occupational History  . Not on file.   Social History Main Topics  . Smoking status: Former Research scientist (life sciences)  . Smokeless tobacco: Not on file  . Alcohol use 0.0 oz/week  . Drug use: No  . Sexual activity: Yes   Other Topics Concern  . Not on file   Social History Narrative  . No narrative on file    Allergies  Allergen Reactions  . Bee Venom   . Penicillins      Review of Systems  Constitutional: Negative for appetite change, chills, fatigue, fever, malaise/fatigue and weight loss.  HENT: Negative for  ear discharge, ear pain and sore throat.   Eyes: Negative for blurred vision.  Respiratory: Negative for cough, sputum production, shortness of breath and wheezing.   Cardiovascular: Negative for chest pain, palpitations, orthopnea, leg swelling and PND.  Gastrointestinal: Negative for abdominal pain, blood in stool, constipation,  diarrhea, heartburn, melena and nausea.  Genitourinary: Negative for dysuria, frequency, hematuria and urgency.  Musculoskeletal: Negative for back pain, joint pain, myalgias and neck pain.  Skin: Negative for rash.  Neurological: Negative for dizziness, tingling, sensory change, focal weakness and headaches.  Endo/Heme/Allergies: Negative for environmental allergies and polydipsia. Does not bruise/bleed easily.  Psychiatric/Behavioral: Positive for depression. Negative for decreased concentration and suicidal ideas. The patient is not nervous/anxious and does not have insomnia.      Objective  Vitals:   01/07/16 0823  BP: 130/80  Pulse: 80  Weight: 292 lb (132.5 kg)  Height: 6\' 1"  (1.854 m)    Physical Exam  Constitutional: He is oriented to person, place, and time and well-developed, well-nourished, and in no distress. He is not irritable.  HENT:  Head: Normocephalic.  Right Ear: Tympanic membrane, external ear and ear canal normal.  Left Ear: Tympanic membrane, external ear and ear canal normal.  Nose: Nose normal.  Mouth/Throat: Uvula is midline, oropharynx is clear and moist and mucous membranes are normal.  Eyes: Conjunctivae and EOM are normal. Pupils are equal, round, and reactive to light. Right eye exhibits no discharge. Left eye exhibits no discharge. No scleral icterus.  Neck: Normal range of motion. Neck supple. No JVD present. No tracheal deviation present. No thyromegaly present.  Cardiovascular: Normal rate, regular rhythm, normal heart sounds and intact distal pulses.  Exam reveals no gallop and no friction rub.   No murmur heard. Pulmonary/Chest: Breath sounds normal. No respiratory distress. He has no wheezes. He has no rales.  Abdominal: Soft. Bowel sounds are normal. He exhibits no mass. There is no hepatosplenomegaly. There is no tenderness. There is no rebound, no guarding and no CVA tenderness.  Musculoskeletal: Normal range of motion. He exhibits no edema or  tenderness.  Lymphadenopathy:    He has no cervical adenopathy.  Neurological: He is alert and oriented to person, place, and time. He has normal sensation, normal strength and intact cranial nerves. No cranial nerve deficit.  Skin: Skin is warm. No rash noted.  Psychiatric: Mood and affect normal.      Assessment & Plan  Problem List Items Addressed This Visit      Cardiovascular and Mediastinum   Hypertension - Primary   Relevant Medications   gemfibrozil (LOPID) 600 MG tablet   hydrochlorothiazide (HYDRODIURIL) 25 MG tablet   lisinopril (PRINIVIL,ZESTRIL) 30 MG tablet   Other Relevant Orders   Renal Function Panel     Other   Anxiety and depression   Relevant Medications   escitalopram (LEXAPRO) 20 MG tablet   Dyslipidemia   Relevant Medications   gemfibrozil (LOPID) 600 MG tablet   Other Relevant Orders   Lipid Profile    Other Visit Diagnoses    Depression       if scored take one half with 20 mg   Relevant Medications   escitalopram (LEXAPRO) 20 MG tablet   Obesity            Dr. Twisha Vanpelt [facility]  01/07/16

## 2016-01-08 LAB — RENAL FUNCTION PANEL
ALBUMIN: 4.5 g/dL (ref 3.5–5.5)
BUN/Creatinine Ratio: 15 (ref 9–20)
BUN: 16 mg/dL (ref 6–24)
CALCIUM: 9.8 mg/dL (ref 8.7–10.2)
CHLORIDE: 99 mmol/L (ref 96–106)
CO2: 23 mmol/L (ref 18–29)
Creatinine, Ser: 1.04 mg/dL (ref 0.76–1.27)
GFR calc Af Amer: 94 mL/min/{1.73_m2} (ref >59)
GFR calc non Af Amer: 82 mL/min/{1.73_m2} (ref >59)
Glucose: 106 mg/dL — ABNORMAL HIGH (ref 65–99)
POTASSIUM: 4.6 mmol/L (ref 3.5–5.2)
Phosphorus: 3.8 mg/dL (ref 2.5–4.5)
SODIUM: 141 mmol/L (ref 134–144)

## 2016-01-08 LAB — LIPID PANEL
CHOL/HDL RATIO: 4.9 ratio (ref 0.0–5.0)
Cholesterol, Total: 204 mg/dL — ABNORMAL HIGH (ref 100–199)
HDL: 42 mg/dL (ref >39)
LDL Calculated: 126 mg/dL — ABNORMAL HIGH (ref 0–99)
Triglycerides: 182 mg/dL — ABNORMAL HIGH (ref 0–149)
VLDL CHOLESTEROL CAL: 36 mg/dL (ref 5–40)

## 2016-03-18 ENCOUNTER — Other Ambulatory Visit: Payer: Self-pay

## 2016-03-18 MED ORDER — ESCITALOPRAM OXALATE 10 MG PO TABS: 10.0000 mg | ORAL_TABLET | Freq: Every day | ORAL | 0 refills | Status: DC

## 2016-04-10 ENCOUNTER — Other Ambulatory Visit: Payer: Self-pay

## 2016-04-10 ENCOUNTER — Telehealth: Payer: Self-pay

## 2016-04-10 MED ORDER — ESCITALOPRAM OXALATE 10 MG PO TABS: 10.0000 mg | ORAL_TABLET | Freq: Every day | ORAL | 2 refills | Status: DC

## 2016-04-10 NOTE — Telephone Encounter (Signed)
Pt called- added 10mg  to 20mg = 30mg  qday. Sent RX to [facility]

## 2016-07-19 ENCOUNTER — Other Ambulatory Visit: Payer: Self-pay | Admitting: Family Medicine

## 2016-07-19 DIAGNOSIS — I10 Essential (primary) hypertension: Secondary | ICD-10-CM

## 2016-08-18 ENCOUNTER — Ambulatory Visit (INDEPENDENT_AMBULATORY_CARE_PROVIDER_SITE_OTHER): Payer: Self-pay | Admitting: Family Medicine

## 2016-08-18 ENCOUNTER — Encounter: Payer: Self-pay | Admitting: Family Medicine

## 2016-08-18 VITALS — BP 120/70 | HR 76 | Ht 73.0 in | Wt 288.0 lb

## 2016-08-18 DIAGNOSIS — F329 Major depressive disorder, single episode, unspecified: Secondary | ICD-10-CM

## 2016-08-18 DIAGNOSIS — E785 Hyperlipidemia, unspecified: Secondary | ICD-10-CM

## 2016-08-18 DIAGNOSIS — F419 Anxiety disorder, unspecified: Secondary | ICD-10-CM

## 2016-08-18 DIAGNOSIS — I10 Essential (primary) hypertension: Secondary | ICD-10-CM

## 2016-08-18 DIAGNOSIS — R000 Tachycardia, unspecified: Secondary | ICD-10-CM

## 2016-08-18 DIAGNOSIS — E781 Pure hyperglyceridemia: Secondary | ICD-10-CM

## 2016-08-18 DIAGNOSIS — F3341 Major depressive disorder, recurrent, in partial remission: Secondary | ICD-10-CM

## 2016-08-18 DIAGNOSIS — R0789 Other chest pain: Secondary | ICD-10-CM

## 2016-08-18 DIAGNOSIS — Z6838 Body mass index (BMI) 38.0-38.9, adult: Secondary | ICD-10-CM

## 2016-08-18 MED ORDER — HYDROCHLOROTHIAZIDE 25 MG PO TABS: 25.0000 mg | ORAL_TABLET | Freq: Every day | ORAL | 1 refills | Status: DC

## 2016-08-18 MED ORDER — LISINOPRIL 30 MG PO TABS: 30.0000 mg | ORAL_TABLET | Freq: Every day | ORAL | 1 refills | Status: DC

## 2016-08-18 MED ORDER — ESCITALOPRAM OXALATE 20 MG PO TABS: 20.0000 mg | ORAL_TABLET | Freq: Every day | ORAL | 1 refills | Status: DC

## 2016-08-18 MED ORDER — GEMFIBROZIL 600 MG PO TABS: 600.0000 mg | ORAL_TABLET | Freq: Two times a day (BID) | ORAL | 1 refills | Status: DC

## 2016-08-18 NOTE — Progress Notes (Signed)
Name: James Bishop   MRN: [id number]    DOB: [birth year]   Date:08/18/2016       Progress Note  Subjective  Chief Complaint  Chief Complaint  Patient presents with  . Depression  . Hypertension  . Hyperlipidemia  . Chest Pain    feels more like a "soreness" in upper L) part of chest- once a week. Occurs when awake and active    Depression         This is a chronic problem.  The current episode started more than 1 year ago.   The onset quality is gradual.   The problem has been waxing and waning since onset.  Associated symptoms include body aches.  Associated symptoms include no decreased concentration, no fatigue, no helplessness, no hopelessness, does not have insomnia, not irritable, no restlessness, no decreased interest, no appetite change, no myalgias, no headaches, no indigestion, not sad and no suicidal ideas.     The symptoms are aggravated by nothing.  Past treatments include SSRIs - Selective serotonin reuptake inhibitors.  Compliance with treatment is good.  Past compliance problems include medication issues.  Previous treatment provided mild relief.   Pertinent negatives include no anxiety. Hypertension  This is a chronic problem. The current episode started more than 1 year ago. The problem is unchanged. The problem is controlled. Associated symptoms include chest pain and palpitations. Pertinent negatives include no anxiety, blurred vision, headaches, malaise/fatigue, neck pain, orthopnea or shortness of breath. Risk factors for coronary artery disease include dyslipidemia and obesity. Past treatments include ACE inhibitors and diuretics. The current treatment provides moderate improvement. There are no compliance problems.  There is no history of angina, kidney disease, CAD/MI, CVA, heart failure, left ventricular hypertrophy, PVD or retinopathy. There is no history of chronic renal disease, a hypertension causing med or renovascular disease.  Hyperlipidemia  This is a chronic  problem. The current episode started more than 1 year ago. The problem is controlled. Exacerbating diseases include obesity. He has no history of chronic renal disease or diabetes. Associated symptoms include chest pain. Pertinent negatives include no focal weakness, myalgias or shortness of breath. Current antihyperlipidemic treatment includes fibric acid derivatives. The current treatment provides moderate improvement of lipids. There are no compliance problems.   Chest Pain   This is a new (duration 24min) problem. The current episode started more than 1 month ago. The problem occurs intermittently. The problem has been waxing and waning. The pain is present in the substernal region. The pain is at a severity of 8/10. The pain is moderate. Quality: "pinching" The pain radiates to the left shoulder. Associated symptoms include diaphoresis, exertional chest pressure and palpitations. Pertinent negatives include no abdominal pain, back pain, cough, dizziness, fever, headaches, hemoptysis, lower extremity edema, malaise/fatigue, nausea, near-syncope, numbness, orthopnea, shortness of breath, sputum production or syncope. He has tried nothing for the symptoms. Risk factors include post-menopausal and stress.  His past medical history is significant for hyperlipidemia and hypertension.  Pertinent negatives for past medical history include no COPD, no CHF, no diabetes, no MI and no PVD.    No problem-specific Assessment & Plan notes found for this encounter.   Past Medical History:  Diagnosis Date  . Depression   . Hyperlipidemia   . Hypertension     No past surgical history on file.  Family History  Problem Relation Age of Onset  . Heart disease Maternal Grandmother   . Heart disease Maternal Grandfather     Social History  Social History  . Marital status: Married    Spouse name: N/A  . Number of children: N/A  . Years of education: N/A   Occupational History  . Not on file.    Social History Main Topics  . Smoking status: Former Research scientist (life sciences)  . Smokeless tobacco: Former Systems developer    Types: Chew  . Alcohol use 0.0 oz/week  . Drug use: No  . Sexual activity: Yes   Other Topics Concern  . Not on file   Social History Narrative  . No narrative on file    Allergies  Allergen Reactions  . Bee Venom   . Penicillins     Outpatient Medications Prior to Visit  Medication Sig Dispense Refill  . escitalopram (LEXAPRO) 20 MG tablet Take 1 tablet (20 mg total) by mouth daily. 30 tablet 5  . gemfibrozil (LOPID) 600 MG tablet Take 1 tablet (600 mg total) by mouth 2 (two) times daily. 60 tablet 5  . hydrochlorothiazide (HYDRODIURIL) 25 MG tablet TAKE 1 TABLET BY MOUTH ONCE DAILY 30 tablet 0  . lisinopril (PRINIVIL,ZESTRIL) 30 MG tablet Take 1 tablet (30 mg total) by mouth daily. 30 tablet 5  . escitalopram (LEXAPRO) 10 MG tablet Take 1 tablet (10 mg total) by mouth daily. 30 tablet 2   No facility-administered medications prior to visit.     Review of Systems  Constitutional: Positive for diaphoresis. Negative for appetite change, chills, fatigue, fever, malaise/fatigue and weight loss.  HENT: Negative for ear discharge, ear pain and sore throat.   Eyes: Negative for blurred vision.  Respiratory: Negative for cough, hemoptysis, sputum production, shortness of breath and wheezing.   Cardiovascular: Positive for chest pain and palpitations. Negative for orthopnea, leg swelling, syncope and near-syncope.  Gastrointestinal: Negative for abdominal pain, blood in stool, constipation, diarrhea, heartburn, melena and nausea.  Genitourinary: Negative for dysuria, frequency, hematuria and urgency.  Musculoskeletal: Negative for back pain, joint pain, myalgias and neck pain.  Skin: Negative for rash.  Neurological: Negative for dizziness, tingling, sensory change, focal weakness, numbness and headaches.  Endo/Heme/Allergies: Negative for environmental allergies and polydipsia.  Does not bruise/bleed easily.  Psychiatric/Behavioral: Positive for depression. Negative for decreased concentration and suicidal ideas. The patient is not nervous/anxious and does not have insomnia.      Objective  Vitals:   08/18/16 0923  BP: 120/70  Pulse: 76  Weight: 288 lb (130.6 kg)  Height: 6\' 1"  (1.854 m)    Physical Exam  Constitutional: He is oriented to person, place, and time and well-developed, well-nourished, and in no distress. He is not irritable.  HENT:  Head: Normocephalic.  Right Ear: External ear normal.  Left Ear: External ear normal.  Nose: Nose normal.  Mouth/Throat: Oropharynx is clear and moist.  Eyes: Conjunctivae and EOM are normal. Pupils are equal, round, and reactive to light. Right eye exhibits no discharge. Left eye exhibits no discharge. No scleral icterus.  Neck: Normal range of motion. Neck supple. No JVD present. No tracheal deviation present. No thyromegaly present.  Cardiovascular: Normal rate, regular rhythm, normal heart sounds and intact distal pulses.  Exam reveals no gallop and no friction rub.   No murmur heard. Pulmonary/Chest: Breath sounds normal. No respiratory distress. He has no wheezes. He has no rales.  Abdominal: Soft. Bowel sounds are normal. He exhibits no mass. There is no hepatosplenomegaly. There is no tenderness. There is no rebound, no guarding and no CVA tenderness.  Musculoskeletal: Normal range of motion. He exhibits no edema or  tenderness.  Lymphadenopathy:    He has no cervical adenopathy.  Neurological: He is alert and oriented to person, place, and time. He has normal sensation, normal strength, normal reflexes and intact cranial nerves. No cranial nerve deficit.  Skin: Skin is warm. No rash noted.  Psychiatric: Mood and affect normal.  Nursing note and vitals reviewed.     Assessment & Plan  Problem List Items Addressed This Visit      Cardiovascular and Mediastinum   Hypertension   Relevant Medications    hydrochlorothiazide (HYDRODIURIL) 25 MG tablet   lisinopril (PRINIVIL,ZESTRIL) 30 MG tablet   gemfibrozil (LOPID) 600 MG tablet   Other Relevant Orders   Renal Function Panel     Other   Anxiety and depression   Relevant Medications   escitalopram (LEXAPRO) 20 MG tablet   Dyslipidemia   Relevant Medications   gemfibrozil (LOPID) 600 MG tablet   Other Relevant Orders   Lipid Profile   Recurrent major depressive disorder, in partial remission (HCC)   Relevant Medications   escitalopram (LEXAPRO) 20 MG tablet    Other Visit Diagnoses    Tachycardia    -  Primary   Relevant Orders   EKG 12-Lead (Completed)   Ambulatory referral to Cardiology   Pure hyperglyceridemia       Relevant Medications   hydrochlorothiazide (HYDRODIURIL) 25 MG tablet   lisinopril (PRINIVIL,ZESTRIL) 30 MG tablet   gemfibrozil (LOPID) 600 MG tablet   Chest pain, atypical       Relevant Orders   Ambulatory referral to Cardiology   Class 2 severe obesity due to excess calories with serious comorbidity and body mass index (BMI) of 38.0 to 38.9 in adult [facility])          Meds ordered this encounter  Medications  . hydrochlorothiazide (HYDRODIURIL) 25 MG tablet    Sig: Take 1 tablet (25 mg total) by mouth daily.    Dispense:  90 tablet    Refill:  1    sched appt  . lisinopril (PRINIVIL,ZESTRIL) 30 MG tablet    Sig: Take 1 tablet (30 mg total) by mouth daily.    Dispense:  90 tablet    Refill:  1  . gemfibrozil (LOPID) 600 MG tablet    Sig: Take 1 tablet (600 mg total) by mouth 2 (two) times daily.    Dispense:  180 tablet    Refill:  1    Must keep appt  . escitalopram (LEXAPRO) 20 MG tablet    Sig: Take 1 tablet (20 mg total) by mouth daily.    Dispense:  90 tablet    Refill:  1      Dr. Otilio Miu [facility]  08/18/16

## 2016-08-19 LAB — RENAL FUNCTION PANEL
Albumin: 4.6 g/dL (ref 3.5–5.5)
BUN/Creatinine Ratio: 17 (ref 9–20)
BUN: 16 mg/dL (ref 6–24)
CALCIUM: 9.4 mg/dL (ref 8.7–10.2)
CO2: 21 mmol/L (ref 18–29)
CREATININE: 0.92 mg/dL (ref 0.76–1.27)
Chloride: 98 mmol/L (ref 96–106)
GFR calc Af Amer: 109 mL/min/{1.73_m2} (ref >59)
GFR calc non Af Amer: 94 mL/min/{1.73_m2} (ref >59)
Glucose: 112 mg/dL — ABNORMAL HIGH (ref 65–99)
PHOSPHORUS: 3 mg/dL (ref 2.5–4.5)
Potassium: 4.3 mmol/L (ref 3.5–5.2)
Sodium: 140 mmol/L (ref 134–144)

## 2016-08-19 LAB — LIPID PANEL
CHOLESTEROL TOTAL: 197 mg/dL (ref 100–199)
Chol/HDL Ratio: 4.8 ratio (ref 0.0–5.0)
HDL: 41 mg/dL (ref >39)
LDL CALC: 111 mg/dL — AB (ref 0–99)
TRIGLYCERIDES: 225 mg/dL — AB (ref 0–149)
VLDL CHOLESTEROL CAL: 45 mg/dL — AB (ref 5–40)

## 2016-09-23 ENCOUNTER — Ambulatory Visit: Admission: EM | Admit: 2016-09-23 | Discharge: 2016-09-23 | Discharge status: Absent without leave | Payer: Self-pay

## 2017-01-12 ENCOUNTER — Ambulatory Visit (INDEPENDENT_AMBULATORY_CARE_PROVIDER_SITE_OTHER): Payer: 59 | Admitting: Family Medicine

## 2017-01-12 ENCOUNTER — Encounter: Payer: Self-pay | Admitting: Family Medicine

## 2017-01-12 VITALS — BP 130/80 | HR 64 | Ht 73.0 in | Wt 285.0 lb

## 2017-01-12 DIAGNOSIS — F3341 Major depressive disorder, recurrent, in partial remission: Secondary | ICD-10-CM

## 2017-01-12 DIAGNOSIS — F329 Major depressive disorder, single episode, unspecified: Secondary | ICD-10-CM | POA: Diagnosis not present

## 2017-01-12 DIAGNOSIS — F32A Depression, unspecified: Secondary | ICD-10-CM

## 2017-01-12 DIAGNOSIS — E785 Hyperlipidemia, unspecified: Secondary | ICD-10-CM | POA: Diagnosis not present

## 2017-01-12 DIAGNOSIS — F419 Anxiety disorder, unspecified: Secondary | ICD-10-CM

## 2017-01-12 DIAGNOSIS — I10 Essential (primary) hypertension: Secondary | ICD-10-CM

## 2017-01-12 MED ORDER — HYDROCHLOROTHIAZIDE 25 MG PO TABS: 25.0000 mg | ORAL_TABLET | Freq: Every day | ORAL | 1 refills | Status: DC

## 2017-01-12 MED ORDER — GEMFIBROZIL 600 MG PO TABS: 600.0000 mg | ORAL_TABLET | Freq: Two times a day (BID) | ORAL | 1 refills | Status: DC

## 2017-01-12 MED ORDER — LISINOPRIL 30 MG PO TABS: 30.0000 mg | ORAL_TABLET | Freq: Every day | ORAL | 1 refills | Status: DC

## 2017-01-12 MED ORDER — ESCITALOPRAM OXALATE 20 MG PO TABS: 20.0000 mg | ORAL_TABLET | Freq: Every day | ORAL | 1 refills | Status: DC

## 2017-01-12 NOTE — Progress Notes (Signed)
Name: James Bishop   MRN: [id number]    DOB: 03-02-1961   Date:01/12/2017       Progress Note  Subjective  Chief Complaint  Chief Complaint  Patient presents with  . Depression  . Hyperlipidemia  . Hypertension    Depression       The patient presents with depression.  This is a chronic problem.  The current episode started more than 1 year ago.   The onset quality is sudden.   The problem occurs daily.  The problem has been gradually improving since onset.  Associated symptoms include no decreased concentration, no fatigue, no helplessness, no hopelessness, does not have insomnia, not irritable, no decreased interest, no myalgias, no headaches, not sad and no suicidal ideas.     The symptoms are aggravated by nothing.  Past treatments include SSRIs - Selective serotonin reuptake inhibitors.  Compliance with treatment is good.  Previous treatment provided mild relief.  Past medical history includes depression.     Pertinent negatives include no chronic pain, no hypothyroidism and no anxiety. Hyperlipidemia  This is a chronic problem. The problem is controlled. Recent lipid tests were reviewed and are normal. Exacerbating diseases include obesity. He has no history of chronic renal disease, diabetes, hypothyroidism or liver disease. There are no known factors aggravating his hyperlipidemia. Pertinent negatives include no chest pain, focal sensory loss, focal weakness, leg pain, myalgias or shortness of breath. Current antihyperlipidemic treatment includes fibric acid derivatives. The current treatment provides moderate improvement of lipids. Risk factors for coronary artery disease include hypertension.  Hypertension  This is a chronic problem. The current episode started more than 1 year ago. The problem has been waxing and waning since onset. The problem is controlled. Pertinent negatives include no anxiety, blurred vision, chest pain, headaches, malaise/fatigue, neck pain, orthopnea,  palpitations, peripheral edema, PND, shortness of breath or sweats. Risk factors for coronary artery disease include dyslipidemia, obesity and male gender. Past treatments include ACE inhibitors and diuretics. The current treatment provides moderate improvement. There are no compliance problems.  There is no history of angina, kidney disease, CAD/MI, CVA, heart failure, left ventricular hypertrophy, PVD or retinopathy. There is no history of chronic renal disease, a hypertension causing med or renovascular disease.    No problem-specific Assessment & Plan notes found for this encounter.   Past Medical History:  Diagnosis Date  . Depression   . Hyperlipidemia   . Hypertension     No past surgical history on file.  Family History  Problem Relation Age of Onset  . Heart disease Maternal Grandmother   . Heart disease Maternal Grandfather     Social History   Social History  . Marital status: Married    Spouse name: N/A  . Number of children: N/A  . Years of education: N/A   Occupational History  . Not on file.   Social History Main Topics  . Smoking status: Former Research scientist (life sciences)  . Smokeless tobacco: Former Systems developer    Types: Chew  . Alcohol use 0.0 oz/week  . Drug use: No  . Sexual activity: Yes   Other Topics Concern  . Not on file   Social History Narrative  . No narrative on file    Allergies  Allergen Reactions  . Bee Venom   . Penicillins     Outpatient Medications Prior to Visit  Medication Sig Dispense Refill  . escitalopram (LEXAPRO) 20 MG tablet Take 1 tablet (20 mg total) by mouth daily. 90 tablet 1  .  gemfibrozil (LOPID) 600 MG tablet Take 1 tablet (600 mg total) by mouth 2 (two) times daily. 180 tablet 1  . hydrochlorothiazide (HYDRODIURIL) 25 MG tablet Take 1 tablet (25 mg total) by mouth daily. 90 tablet 1  . lisinopril (PRINIVIL,ZESTRIL) 30 MG tablet Take 1 tablet (30 mg total) by mouth daily. 90 tablet 1   No facility-administered medications prior to  visit.     Review of Systems  Constitutional: Negative for chills, fatigue, fever, malaise/fatigue and weight loss.  HENT: Negative for ear discharge, ear pain and sore throat.   Eyes: Negative for blurred vision.  Respiratory: Negative for cough, sputum production, shortness of breath and wheezing.   Cardiovascular: Negative for chest pain, palpitations, orthopnea, leg swelling and PND.  Gastrointestinal: Negative for abdominal pain, blood in stool, constipation, diarrhea, heartburn, melena and nausea.  Genitourinary: Negative for dysuria, frequency, hematuria and urgency.  Musculoskeletal: Negative for back pain, joint pain, myalgias and neck pain.  Skin: Negative for rash.  Neurological: Negative for dizziness, tingling, sensory change, focal weakness and headaches.  Endo/Heme/Allergies: Negative for environmental allergies and polydipsia. Does not bruise/bleed easily.  Psychiatric/Behavioral: Positive for depression. Negative for decreased concentration and suicidal ideas. The patient is not nervous/anxious and does not have insomnia.      Objective  Vitals:   01/12/17 0905  BP: 130/80  Pulse: 64  Weight: 285 lb (129.3 kg)  Height: 6\' 1"  (1.854 m)    Physical Exam  Constitutional: He is oriented to person, place, and time and well-developed, well-nourished, and in no distress. He is not irritable.  HENT:  Head: Normocephalic.  Right Ear: External ear normal.  Left Ear: External ear normal.  Nose: Nose normal.  Mouth/Throat: Oropharynx is clear and moist.  Eyes: Pupils are equal, round, and reactive to light. Conjunctivae and EOM are normal. Right eye exhibits no discharge. Left eye exhibits no discharge. No scleral icterus.  Neck: Normal range of motion. Neck supple. No JVD present. No tracheal deviation present. No thyromegaly present.  Cardiovascular: Normal rate, regular rhythm, normal heart sounds and intact distal pulses.  Exam reveals no gallop and no friction rub.    No murmur heard. Pulmonary/Chest: Breath sounds normal. No respiratory distress. He has no wheezes. He has no rales.  Abdominal: Soft. Bowel sounds are normal. He exhibits no mass. There is no hepatosplenomegaly. There is no tenderness. There is no rebound, no guarding and no CVA tenderness.  Musculoskeletal: Normal range of motion. He exhibits no edema or tenderness.  Lymphadenopathy:    He has no cervical adenopathy.  Neurological: He is alert and oriented to person, place, and time. He has normal sensation, normal strength, normal reflexes and intact cranial nerves. No cranial nerve deficit.  Skin: Skin is warm. No rash noted.  Psychiatric: Mood and affect normal.  Nursing note and vitals reviewed.     Assessment & Plan  Problem List Items Addressed This Visit      Cardiovascular and Mediastinum   Hypertension - Primary   Relevant Medications   nitroGLYCERIN (NITROSTAT) 0.4 MG SL tablet   aspirin EC 81 MG tablet   gemfibrozil (LOPID) 600 MG tablet   hydrochlorothiazide (HYDRODIURIL) 25 MG tablet   lisinopril (PRINIVIL,ZESTRIL) 30 MG tablet     Other   Anxiety and depression   Relevant Medications   escitalopram (LEXAPRO) 20 MG tablet   Dyslipidemia   Relevant Medications   gemfibrozil (LOPID) 600 MG tablet   Recurrent major depressive disorder, in partial remission ([place])  Relevant Medications   escitalopram (LEXAPRO) 20 MG tablet      Meds ordered this encounter  Medications  . DISCONTD: gemfibrozil (LOPID) 600 MG tablet    Sig: Take 1 tablet (600 mg total) by mouth 2 (two) times daily.    Dispense:  180 tablet    Refill:  1    Must keep appt  . DISCONTD: hydrochlorothiazide (HYDRODIURIL) 25 MG tablet    Sig: Take 1 tablet (25 mg total) by mouth daily.    Dispense:  90 tablet    Refill:  1    sched appt  . DISCONTD: lisinopril (PRINIVIL,ZESTRIL) 30 MG tablet    Sig: Take 1 tablet (30 mg total) by mouth daily.    Dispense:  90 tablet    Refill:  1  .  DISCONTD: escitalopram (LEXAPRO) 20 MG tablet    Sig: Take 1 tablet (20 mg total) by mouth daily.    Dispense:  90 tablet    Refill:  1  . escitalopram (LEXAPRO) 20 MG tablet    Sig: Take 1 tablet (20 mg total) by mouth daily.    Dispense:  90 tablet    Refill:  1  . gemfibrozil (LOPID) 600 MG tablet    Sig: Take 1 tablet (600 mg total) by mouth 2 (two) times daily.    Dispense:  180 tablet    Refill:  1  . hydrochlorothiazide (HYDRODIURIL) 25 MG tablet    Sig: Take 1 tablet (25 mg total) by mouth daily.    Dispense:  90 tablet    Refill:  1  . lisinopril (PRINIVIL,ZESTRIL) 30 MG tablet    Sig: Take 1 tablet (30 mg total) by mouth daily.    Dispense:  90 tablet    Refill:  1      Dr. Otilio Miu [facility]  01/12/17

## 2017-07-13 ENCOUNTER — Ambulatory Visit: Payer: 59 | Admitting: Family Medicine

## 2017-09-15 ENCOUNTER — Other Ambulatory Visit: Payer: Self-pay

## 2017-09-15 DIAGNOSIS — F329 Major depressive disorder, single episode, unspecified: Secondary | ICD-10-CM

## 2017-09-15 DIAGNOSIS — E785 Hyperlipidemia, unspecified: Secondary | ICD-10-CM

## 2017-09-15 DIAGNOSIS — F419 Anxiety disorder, unspecified: Secondary | ICD-10-CM

## 2017-09-15 DIAGNOSIS — I10 Essential (primary) hypertension: Secondary | ICD-10-CM

## 2017-09-15 DIAGNOSIS — F3341 Major depressive disorder, recurrent, in partial remission: Secondary | ICD-10-CM

## 2017-09-15 MED ORDER — GEMFIBROZIL 600 MG PO TABS: 600.0000 mg | ORAL_TABLET | Freq: Two times a day (BID) | ORAL | 0 refills | Status: DC

## 2017-09-15 MED ORDER — HYDROCHLOROTHIAZIDE 25 MG PO TABS: 25.0000 mg | ORAL_TABLET | Freq: Every day | ORAL | 0 refills | Status: DC

## 2017-09-15 MED ORDER — LISINOPRIL 30 MG PO TABS: 30.0000 mg | ORAL_TABLET | Freq: Every day | ORAL | 0 refills | Status: DC

## 2017-09-15 MED ORDER — ESCITALOPRAM OXALATE 20 MG PO TABS: 20.0000 mg | ORAL_TABLET | Freq: Every day | ORAL | 0 refills | Status: DC

## 2017-09-21 ENCOUNTER — Ambulatory Visit: Payer: 59 | Admitting: Family Medicine

## 2017-09-21 ENCOUNTER — Encounter: Payer: Self-pay | Admitting: Family Medicine

## 2017-09-21 ENCOUNTER — Other Ambulatory Visit: Payer: Self-pay

## 2017-09-21 VITALS — BP 120/90 | HR 64 | Ht 73.0 in | Wt 284.0 lb

## 2017-09-21 DIAGNOSIS — Z114 Encounter for screening for human immunodeficiency virus [HIV]: Secondary | ICD-10-CM | POA: Diagnosis not present

## 2017-09-21 DIAGNOSIS — F329 Major depressive disorder, single episode, unspecified: Secondary | ICD-10-CM

## 2017-09-21 DIAGNOSIS — Z6838 Body mass index (BMI) 38.0-38.9, adult: Secondary | ICD-10-CM | POA: Insufficient documentation

## 2017-09-21 DIAGNOSIS — Z1211 Encounter for screening for malignant neoplasm of colon: Secondary | ICD-10-CM | POA: Diagnosis not present

## 2017-09-21 DIAGNOSIS — Z1159 Encounter for screening for other viral diseases: Secondary | ICD-10-CM | POA: Diagnosis not present

## 2017-09-21 DIAGNOSIS — F419 Anxiety disorder, unspecified: Secondary | ICD-10-CM | POA: Diagnosis not present

## 2017-09-21 DIAGNOSIS — C44212 Basal cell carcinoma of skin of right ear and external auricular canal: Secondary | ICD-10-CM | POA: Diagnosis not present

## 2017-09-21 DIAGNOSIS — E785 Hyperlipidemia, unspecified: Secondary | ICD-10-CM

## 2017-09-21 DIAGNOSIS — R69 Illness, unspecified: Secondary | ICD-10-CM | POA: Diagnosis not present

## 2017-09-21 DIAGNOSIS — I10 Essential (primary) hypertension: Secondary | ICD-10-CM | POA: Diagnosis not present

## 2017-09-21 MED ORDER — GEMFIBROZIL 600 MG PO TABS: 600.0000 mg | ORAL_TABLET | Freq: Two times a day (BID) | ORAL | 1 refills | Status: DC

## 2017-09-21 MED ORDER — HYDROCHLOROTHIAZIDE 25 MG PO TABS: 25.0000 mg | ORAL_TABLET | Freq: Every day | ORAL | 1 refills | Status: DC

## 2017-09-21 MED ORDER — ESCITALOPRAM OXALATE 20 MG PO TABS: 20.0000 mg | ORAL_TABLET | Freq: Every day | ORAL | 1 refills | Status: DC

## 2017-09-21 MED ORDER — LISINOPRIL 30 MG PO TABS: 30.0000 mg | ORAL_TABLET | Freq: Every day | ORAL | 1 refills | Status: DC

## 2017-09-21 NOTE — Assessment & Plan Note (Signed)
Controlled will continue on lisinopril 30mg  and HCTZ 25 mg q day

## 2017-09-21 NOTE — Assessment & Plan Note (Signed)
Controlled will continue lexapro 20 mg daily.

## 2017-09-21 NOTE — Assessment & Plan Note (Signed)
Hypertriglyceridemia controlled on gemfibribrizole 600mg  bid

## 2017-09-21 NOTE — Progress Notes (Signed)
Name: James Bishop   MRN: [id number]    DOB: 05/22/1960   Date:09/21/2017       Progress Note  Subjective  Chief Complaint  Chief Complaint  Patient presents with  . Depression  . Hyperlipidemia  . Hypertension  . Colon Cancer Screening  . hiv screening    needs labs drawn plus hep c    Patient hepatitis c screen and colonoscopy.   Depression       The patient presents with depression.  This is a chronic problem.  The current episode started more than 1 year ago.   The onset quality is gradual.   The problem occurs intermittently.  The problem has been gradually improving since onset.  Associated symptoms include no decreased concentration, no fatigue, no helplessness, no hopelessness, does not have insomnia, not irritable, no restlessness, no decreased interest, no appetite change, no body aches, no myalgias, no headaches, no indigestion, not sad and no suicidal ideas.     The symptoms are aggravated by nothing.  Past treatments include SSRIs - Selective serotonin reuptake inhibitors.  Compliance with treatment is good.  Previous treatment provided mild relief.  Risk factors include a change in medication usage/dosage.   Past medical history includes depression.     Pertinent negatives include no hypothyroidism and no anxiety. Hyperlipidemia  This is a chronic problem. The current episode started more than 1 year ago. The problem is controlled. Recent lipid tests were reviewed and are normal. He has no history of chronic renal disease, diabetes, hypothyroidism, liver disease, obesity or nephrotic syndrome. Factors aggravating his hyperlipidemia include thiazides. Pertinent negatives include no chest pain, focal sensory loss, focal weakness, leg pain, myalgias or shortness of breath. Current antihyperlipidemic treatment includes fibric acid derivatives. The current treatment provides moderate improvement of lipids. There are no compliance problems.  Risk factors for coronary artery disease  include dyslipidemia, hypertension and obesity.  Hypertension  This is a chronic problem. The current episode started more than 1 year ago. The problem has been gradually improving since onset. The problem is controlled. Pertinent negatives include no anxiety, chest pain, headaches, neck pain, orthopnea, palpitations, peripheral edema, PND or shortness of breath. There are no associated agents to hypertension. There are no known risk factors for coronary artery disease. Past treatments include ACE inhibitors and diuretics. The current treatment provides moderate improvement. There are no compliance problems.  There is no history of angina, kidney disease, CAD/MI, CVA, heart failure, left ventricular hypertrophy, PVD or retinopathy. stress test normal. There is no history of chronic renal disease, a hypertension causing med or renovascular disease.    Anxiety and depression Controlled will continue lexapro 20 mg daily.  Dyslipidemia Hypertriglyceridemia controlled on gemfibribrizole 600mg  bid  Hypertension Controlled will continue on lisinopril 30mg  and HCTZ 25 mg q day   Past Medical History:  Diagnosis Date  . Depression   . Hyperlipidemia   . Hypertension     History reviewed. No pertinent surgical history.  Family History  Problem Relation Age of Onset  . Heart disease Maternal Grandmother   . Heart disease Maternal Grandfather     Social History   Socioeconomic History  . Marital status: Married    Spouse name: Not on file  . Number of children: Not on file  . Years of education: Not on file  . Highest education level: Not on file  Occupational History  . Not on file  Social Needs  . Financial resource strain: Not on file  .  Food insecurity:    Worry: Not on file    Inability: Not on file  . Transportation needs:    Medical: Not on file    Non-medical: Not on file  Tobacco Use  . Smoking status: Former Research scientist (life sciences)  . Smokeless tobacco: Former Systems developer    Types: Chew   Substance and Sexual Activity  . Alcohol use: Yes    Alcohol/week: 0.0 oz  . Drug use: No  . Sexual activity: Yes  Lifestyle  . Physical activity:    Days per week: Not on file    Minutes per session: Not on file  . Stress: Not on file  Relationships  . Social connections:    Talks on phone: Not on file    Gets together: Not on file    Attends religious service: Not on file    Active member of club or organization: Not on file    Attends meetings of clubs or organizations: Not on file    Relationship status: Not on file  . Intimate partner violence:    Fear of current or ex partner: Not on file    Emotionally abused: Not on file    Physically abused: Not on file    Forced sexual activity: Not on file  Other Topics Concern  . Not on file  Social History Narrative  . Not on file    Allergies  Allergen Reactions  . Bee Venom   . Penicillins     Outpatient Medications Prior to Visit  Medication Sig Dispense Refill  . nitroGLYCERIN (NITROSTAT) 0.4 MG SL tablet Place under the tongue as needed.    Marland Kitchen escitalopram (LEXAPRO) 20 MG tablet Take 1 tablet (20 mg total) by mouth daily. 7 tablet 0  . gemfibrozil (LOPID) 600 MG tablet Take 1 tablet (600 mg total) by mouth 2 (two) times daily. 14 tablet 0  . hydrochlorothiazide (HYDRODIURIL) 25 MG tablet Take 1 tablet (25 mg total) by mouth daily. 7 tablet 0  . lisinopril (PRINIVIL,ZESTRIL) 30 MG tablet Take 1 tablet (30 mg total) by mouth daily. 7 tablet 0   No facility-administered medications prior to visit.     Review of Systems  Constitutional: Negative for appetite change and fatigue.  Respiratory: Negative for shortness of breath.   Cardiovascular: Negative for chest pain, palpitations, orthopnea and PND.  Musculoskeletal: Negative for myalgias and neck pain.  Neurological: Negative for focal weakness and headaches.  Psychiatric/Behavioral: Positive for depression. Negative for decreased concentration and suicidal ideas.  The patient does not have insomnia.      Objective  Vitals:   09/21/17 1026  BP: 120/90  Pulse: 64  Weight: 284 lb (128.8 kg)  Height: 6\' 1"  (1.854 m)    Physical Exam  Constitutional: He is oriented to person, place, and time. He is not irritable.  HENT:  Head: Normocephalic.  Right Ear: Hearing, tympanic membrane and external ear normal.  Left Ear: Hearing, tympanic membrane and external ear normal.  Nose: Nose normal. No mucosal edema.  Mouth/Throat: Uvula is midline, oropharynx is clear and moist and mucous membranes are normal. No oropharyngeal exudate, posterior oropharyngeal edema or posterior oropharyngeal erythema.  Eyes: Pupils are equal, round, and reactive to light. Conjunctivae and EOM are normal. Right eye exhibits no discharge. Left eye exhibits no discharge. No scleral icterus.  Neck: Normal range of motion. Neck supple. Normal carotid pulses, no hepatojugular reflux and no JVD present. Carotid bruit is not present. No tracheal deviation present. No thyromegaly present.  Cardiovascular: Normal rate, regular rhythm, normal heart sounds and intact distal pulses. Exam reveals no gallop and no friction rub.  No murmur heard. Pulmonary/Chest: Breath sounds normal. No respiratory distress. He has no wheezes. He has no rales.  Abdominal: Soft. Bowel sounds are normal. He exhibits no mass. There is no hepatosplenomegaly. There is no tenderness. There is no rebound, no guarding and no CVA tenderness.  Genitourinary: Rectum normal and prostate normal.  Musculoskeletal: Normal range of motion. He exhibits no edema or tenderness.  Feet:  Right Foot:  Skin Integrity: Negative for ulcer, blister, skin breakdown or erythema.  Left Foot:  Skin Integrity: Negative for ulcer, blister, skin breakdown or erythema.  Lymphadenopathy:    He has no cervical adenopathy.  Neurological: He is alert and oriented to person, place, and time. He has normal strength and normal reflexes. No  cranial nerve deficit.  Skin: Skin is warm. No rash noted.  scaling painful area of left pinnae  Nursing note and vitals reviewed.     Assessment & Plan  Problem List Items Addressed This Visit      Cardiovascular and Mediastinum   Hypertension - Primary    Controlled will continue on lisinopril 30mg  and HCTZ 25 mg q day      Relevant Medications   gemfibrozil (LOPID) 600 MG tablet   hydrochlorothiazide (HYDRODIURIL) 25 MG tablet   lisinopril (PRINIVIL,ZESTRIL) 30 MG tablet   Other Relevant Orders   Renal Function Panel     Other   Anxiety and depression    Controlled will continue lexapro 20 mg daily.      Relevant Medications   escitalopram (LEXAPRO) 20 MG tablet   Dyslipidemia    Hypertriglyceridemia controlled on gemfibribrizole 600mg  bid      Relevant Medications   gemfibrozil (LOPID) 600 MG tablet   Other Relevant Orders   Lipid Panel With LDL/HDL Ratio   Class 2 severe obesity due to excess calories with serious comorbidity and body mass index (BMI) of 38.0 to 38.9 in adult [facility])    Other Visit Diagnoses    Need for hepatitis C screening test       patient desires hepatitis c screen birthdate [birth year].   Relevant Orders   Hepatitis C antibody   Colon cancer screening       Patient desires colonoscopy screen for cancer. Patient is guaiac negative and asymptomatic.   Relevant Orders   Ambulatory referral to Gastroenterology   Encounter for screening for HIV       Patient desires HIV screen  with no symptoms noted.   Relevant Orders   HIV antibody   Taking medication for chronic disease       patient on medication to monitor for hepatic effect.   Relevant Orders   Hepatic Function Panel (6)   Basal cell carcinoma (BCC) of skin of right ear       Painful scalig of left pinnae. will refer to dermatology for evaluate and treat.   Relevant Orders   Ambulatory referral to Dermatology      Meds ordered this encounter  Medications  . escitalopram (LEXAPRO)  20 MG tablet    Sig: Take 1 tablet (20 mg total) by mouth daily.    Dispense:  90 tablet    Refill:  1  . gemfibrozil (LOPID) 600 MG tablet    Sig: Take 1 tablet (600 mg total) by mouth 2 (two) times daily.    Dispense:  180 tablet    Refill:  1  . hydrochlorothiazide (HYDRODIURIL) 25 MG tablet    Sig: Take 1 tablet (25 mg total) by mouth daily.    Dispense:  90 tablet    Refill:  1  . lisinopril (PRINIVIL,ZESTRIL) 30 MG tablet    Sig: Take 1 tablet (30 mg total) by mouth daily.    Dispense:  90 tablet    Refill:  1  Health risks of being over weight were discussed and patient was counseled on weight loss options and exercise.    Dr. [facility]  09/21/17

## 2017-09-22 LAB — RENAL FUNCTION PANEL
Albumin: 4.4 g/dL (ref 3.5–5.5)
BUN/Creatinine Ratio: 15 (ref 9–20)
BUN: 13 mg/dL (ref 6–24)
CALCIUM: 9.4 mg/dL (ref 8.7–10.2)
CO2: 24 mmol/L (ref 20–29)
CREATININE: 0.89 mg/dL (ref 0.76–1.27)
Chloride: 101 mmol/L (ref 96–106)
GFR calc Af Amer: 111 mL/min/{1.73_m2} (ref >59)
GFR, EST NON AFRICAN AMERICAN: 96 mL/min/{1.73_m2} (ref >59)
Glucose: 95 mg/dL (ref 65–99)
PHOSPHORUS: 3.1 mg/dL (ref 2.5–4.5)
Potassium: 4.5 mmol/L (ref 3.5–5.2)
SODIUM: 143 mmol/L (ref 134–144)

## 2017-09-22 LAB — LIPID PANEL WITH LDL/HDL RATIO
Cholesterol, Total: 172 mg/dL (ref 100–199)
HDL: 33 mg/dL — AB (ref >39)
LDL Calculated: 112 mg/dL — ABNORMAL HIGH (ref 0–99)
LDL/HDL RATIO: 3.4 ratio (ref 0.0–3.6)
Triglycerides: 137 mg/dL (ref 0–149)
VLDL CHOLESTEROL CAL: 27 mg/dL (ref 5–40)

## 2017-09-22 LAB — HEPATIC FUNCTION PANEL (6)
ALT: 25 IU/L (ref 0–44)
AST: 17 IU/L (ref 0–40)
Alkaline Phosphatase: 97 IU/L (ref 39–117)
BILIRUBIN TOTAL: 0.7 mg/dL (ref 0.0–1.2)
BILIRUBIN, DIRECT: 0.15 mg/dL (ref 0.00–0.40)

## 2017-09-22 LAB — HIV ANTIBODY (ROUTINE TESTING W REFLEX): HIV Screen 4th Generation wRfx: NONREACTIVE

## 2017-09-22 LAB — HEPATITIS C ANTIBODY: Hep C Virus Ab: <0.1 s/co ratio (ref 0.0–0.9)

## 2017-09-23 ENCOUNTER — Other Ambulatory Visit: Payer: Self-pay

## 2017-09-28 ENCOUNTER — Other Ambulatory Visit: Payer: Self-pay

## 2017-09-28 MED ORDER — NA SULFATE-K SULFATE-MG SULF 17.5-3.13-1.6 GM/177ML PO SOLN: 1.0000 | Freq: Once | ORAL | 0 refills | Status: AC

## 2017-09-30 ENCOUNTER — Other Ambulatory Visit: Payer: Self-pay

## 2017-09-30 NOTE — Discharge Instructions (Signed)
General Anesthesia, Adult, Care After °These instructions provide you with information about caring for yourself after your procedure. Your health care provider may also give you more specific instructions. Your treatment has been planned according to current medical practices, but problems sometimes occur. Call your health care provider if you have any problems or questions after your procedure. °What can I expect after the procedure? °After the procedure, it is common to have: °· Vomiting. °· A sore throat. °· Mental slowness. ° °It is common to feel: °· Nauseous. °· Cold or shivery. °· Sleepy. °· Tired. °· Sore or achy, even in parts of your body where you did not have surgery. ° °Follow these instructions at home: °For at least 24 hours after the procedure: °· Do not: °? Participate in activities where you could fall or become injured. °? Drive. °? Use heavy machinery. °? Drink alcohol. °? Take sleeping pills or medicines that cause drowsiness. °? Make important decisions or sign legal documents. °? Take care of children on your own. °· Rest. °Eating and drinking °· If you vomit, drink water, juice, or soup when you can drink without vomiting. °· Drink enough fluid to keep your urine clear or pale yellow. °· Make sure you have little or no nausea before eating solid foods. °· Follow the diet recommended by your health care provider. °General instructions °· Have a responsible adult stay with you until you are awake and alert. °· Return to your normal activities as told by your health care provider. Ask your health care provider what activities are safe for you. °· Take over-the-counter and prescription medicines only as told by your health care provider. °· If you smoke, do not smoke without supervision. °· Keep all follow-up visits as told by your health care provider. This is important. °Contact a health care provider if: °· You continue to have nausea or vomiting at home, and medicines are not helpful. °· You  cannot drink fluids or start eating again. °· You cannot urinate after 8-12 hours. °· You develop a skin rash. °· You have fever. °· You have increasing redness at the site of your procedure. °Get help right away if: °· You have difficulty breathing. °· You have chest pain. °· You have unexpected bleeding. °· You feel that you are having a life-threatening or urgent problem. °This information is not intended to replace advice given to you by your health care provider. Make sure you discuss any questions you have with your health care provider. °Document Released: 07/14/2000 Document Revised: 09/10/2015 Document Reviewed: 03/22/2015 °Elsevier Interactive Patient Education © 2018 [employer] ° °

## 2017-10-01 ENCOUNTER — Ambulatory Visit
Admission: RE | Admit: 2017-10-01 | Discharge: 2017-10-01 | Disposition: A | Discharge status: Home / Self Care | Payer: 59 | Source: Ambulatory Visit | Attending: Gastroenterology | Admitting: Gastroenterology

## 2017-10-01 ENCOUNTER — Ambulatory Visit: Payer: 59 | Admitting: Anesthesiology

## 2017-10-01 ENCOUNTER — Encounter
Admission: RE | Disposition: A | Discharge status: Home / Self Care | Payer: Self-pay | Source: Ambulatory Visit | Attending: Gastroenterology

## 2017-10-01 DIAGNOSIS — K641 Second degree hemorrhoids: Secondary | ICD-10-CM | POA: Diagnosis not present

## 2017-10-01 DIAGNOSIS — I10 Essential (primary) hypertension: Secondary | ICD-10-CM | POA: Insufficient documentation

## 2017-10-01 DIAGNOSIS — Z1211 Encounter for screening for malignant neoplasm of colon: Secondary | ICD-10-CM | POA: Diagnosis not present

## 2017-10-01 DIAGNOSIS — F329 Major depressive disorder, single episode, unspecified: Secondary | ICD-10-CM | POA: Insufficient documentation

## 2017-10-01 DIAGNOSIS — K635 Polyp of colon: Secondary | ICD-10-CM

## 2017-10-01 DIAGNOSIS — D125 Benign neoplasm of sigmoid colon: Secondary | ICD-10-CM | POA: Diagnosis not present

## 2017-10-01 DIAGNOSIS — K5730 Diverticulosis of large intestine without perforation or abscess without bleeding: Secondary | ICD-10-CM | POA: Diagnosis not present

## 2017-10-01 HISTORY — PX: POLYPECTOMY: SHX5525

## 2017-10-01 HISTORY — DX: Cardiac murmur, unspecified: R01.1

## 2017-10-01 HISTORY — PX: COLONOSCOPY WITH PROPOFOL: SHX5780

## 2017-10-01 SURGERY — COLONOSCOPY WITH PROPOFOL
Anesthesia: General | Wound class: Contaminated

## 2017-10-01 MED ORDER — ACETAMINOPHEN 160 MG/5ML PO SOLN: 325.0000 mg | ORAL | Status: DC | PRN

## 2017-10-01 MED ORDER — SODIUM CHLORIDE 0.9 % IV SOLN: INTRAVENOUS | Status: DC

## 2017-10-01 MED ORDER — ONDANSETRON HCL 4 MG/2ML IJ SOLN: 4.0000 mg | Freq: Once | INTRAMUSCULAR | Status: DC | PRN

## 2017-10-01 MED ORDER — LIDOCAINE HCL (CARDIAC) PF 100 MG/5ML IV SOSY
PREFILLED_SYRINGE | INTRAVENOUS | Status: DC | PRN
  Administered 2017-10-01: 40 mg via INTRAVENOUS

## 2017-10-01 MED ORDER — LACTATED RINGERS IV SOLN
10.0000 mL/h | INTRAVENOUS | Status: DC
  Administered 2017-10-01: 10 mL/h via INTRAVENOUS

## 2017-10-01 MED ORDER — PROPOFOL 10 MG/ML IV BOLUS
INTRAVENOUS | Status: DC | PRN
  Administered 2017-10-01: 20 mg via INTRAVENOUS
  Administered 2017-10-01: 100 mg via INTRAVENOUS
  Administered 2017-10-01 (×3): 20 mg via INTRAVENOUS
  Administered 2017-10-01 (×2): 50 mg via INTRAVENOUS
  Administered 2017-10-01: 20 mg via INTRAVENOUS

## 2017-10-01 MED ORDER — ACETAMINOPHEN 325 MG PO TABS: 325.0000 mg | ORAL_TABLET | ORAL | Status: DC | PRN

## 2017-10-01 MED ORDER — STERILE WATER FOR IRRIGATION IR SOLN
Status: DC | PRN
  Administered 2017-10-01: 2 mL

## 2017-10-01 SURGICAL SUPPLY — 24 items
CANISTER SUCT 1200ML W/VALVE (MISCELLANEOUS) ×4 IMPLANT
CLIP HMST 235XBRD CATH ROT (MISCELLANEOUS) IMPLANT
CLIP RESOLUTION 360 11X235 (MISCELLANEOUS)
ELECT REM PT RETURN 9FT ADLT (ELECTROSURGICAL)
ELECTRODE REM PT RTRN 9FT ADLT (ELECTROSURGICAL) IMPLANT
FCP ESCP3.2XJMB 240X2.8X (MISCELLANEOUS)
FORCEPS BIOP RAD 4 LRG CAP 4 (CUTTING FORCEPS) ×4 IMPLANT
FORCEPS BIOP RJ4 240 W/NDL (MISCELLANEOUS)
FORCEPS ESCP3.2XJMB 240X2.8X (MISCELLANEOUS) IMPLANT
GOWN CVR UNV OPN BCK APRN NK (MISCELLANEOUS) ×4 IMPLANT
GOWN ISOL THUMB LOOP REG UNIV (MISCELLANEOUS) ×4
INJECTOR VARIJECT VIN23 (MISCELLANEOUS) IMPLANT
KIT DEFENDO VALVE AND CONN (KITS) IMPLANT
KIT ENDO PROCEDURE OLY (KITS) ×4 IMPLANT
MARKER SPOT ENDO TATTOO 5ML (MISCELLANEOUS) IMPLANT
PROBE APC STR FIRE (PROBE) IMPLANT
RETRIEVER NET ROTH 2.5X230 LF (MISCELLANEOUS) IMPLANT
SNARE SHORT THROW 13M SML OVAL (MISCELLANEOUS) IMPLANT
SNARE SHORT THROW 30M LRG OVAL (MISCELLANEOUS) IMPLANT
SNARE SNG USE RND 15MM (INSTRUMENTS) IMPLANT
SPOT EX ENDOSCOPIC TATTOO (MISCELLANEOUS)
TRAP ETRAP POLY (MISCELLANEOUS) IMPLANT
VARIJECT INJECTOR VIN23 (MISCELLANEOUS)
WATER STERILE IRR 250ML POUR (IV SOLUTION) ×4 IMPLANT

## 2017-10-01 NOTE — Anesthesia Procedure Notes (Signed)
Procedure Name: MAC Date/Time: 10/01/2017 8:17 AM Performed by: Janna Arch, CRNA Pre-anesthesia Checklist: Patient identified, Emergency Drugs available, Suction available and Patient being monitored Patient Re-evaluated:Patient Re-evaluated prior to induction Oxygen Delivery Method: Nasal cannula

## 2017-10-01 NOTE — H&P (Signed)
Lucilla Lame, MD [address] [place], [place] [address] Phone: [phone] Fax : [phone]  Primary Care Physician:  Juline Patch, MD Primary Gastroenterologist:  Dr. Allen Norris  Pre-Procedure History & Physical: HPI:  James Bishop is a 57 y.o. male is here for a screening colonoscopy.   Past Medical History:  Diagnosis Date  . Depression   . 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 Hyperlipidemia   . Hypertension    controlled    History reviewed. No pertinent surgical history.  Prior to Admission medications   Medication Sig Start Date End Date Taking? Authorizing Provider  escitalopram (LEXAPRO) 20 MG tablet Take 1 tablet (20 mg total) by mouth daily. Patient taking differently: Take 20 mg by mouth daily. am 09/21/17  Yes Juline Patch, MD  gemfibrozil (LOPID) 600 MG tablet Take 1 tablet (600 mg total) by mouth 2 (two) times daily. Patient taking differently: Take 600 mg by mouth daily. am 09/21/17  Yes Juline Patch, MD  hydrochlorothiazide (HYDRODIURIL) 25 MG tablet Take 1 tablet (25 mg total) by mouth daily. Patient taking differently: Take 25 mg by mouth daily. am 09/21/17  Yes Juline Patch, MD  lisinopril (PRINIVIL,ZESTRIL) 30 MG tablet Take 1 tablet (30 mg total) by mouth daily. Patient taking differently: Take 30 mg by mouth daily. am 09/21/17  Yes Juline Patch, MD  Omega-3 Fatty Acids (FISH OIL) 500 MG CAPS Take by mouth daily.   Yes [provider]    Allergies as of 09/21/2017 - Review Complete 09/21/2017  Allergen Reaction Noted  . Bee venom  09/27/2014  . Penicillins  09/27/2014    Family History  Problem Relation Age of Onset  . Heart disease Maternal Grandmother   . Heart disease Maternal Grandfather     Social History   Socioeconomic History  . Marital status: Married    Spouse name: Not on file  . Number of children: Not on file  . Years of education: Not on file  . Highest education level: Not on file    Occupational History  . Not on file  Social Needs  . Financial resource strain: Not on file  . Food insecurity:    Worry: Not on file    Inability: Not on file  . Transportation needs:    Medical: Not on file    Non-medical: Not on file  Tobacco Use  . Smoking status: Never Smoker  . Smokeless tobacco: Never Used  Substance and Sexual Activity  . Alcohol use: Not Currently    Alcohol/week: 0.0 oz  . Drug use: No  . Sexual activity: Yes  Lifestyle  . Physical activity:    Days per week: Not on file    Minutes per session: Not on file  . Stress: Not on file  Relationships  . Social connections:    Talks on phone: Not on file    Gets together: Not on file    Attends religious service: Not on file    Active member of club or organization: Not on file    Attends meetings of clubs or organizations: Not on file    Relationship status: Not on file  . Intimate partner violence:    Fear of current or ex partner: Not on file    Emotionally abused: Not on file    Physically abused: Not on file    Forced sexual activity: Not on file  Other Topics Concern  .  Not on file  Social History Narrative  . Not on file    Review of Systems: See HPI, otherwise negative ROS  Physical Exam: BP 139/68   Pulse 80   Temp 97.7 F (36.5 C) (Temporal)   Resp 16   Ht 6\' 1"  (1.854 m)   Wt 274 lb (124.3 kg)   SpO2 99%   BMI 36.15 kg/m  General:   Alert,  pleasant and cooperative in NAD Head:  Normocephalic and atraumatic. Neck:  Supple; no masses or thyromegaly. Lungs:  Clear throughout to auscultation.    Heart:  Regular rate and rhythm. Abdomen:  Soft, nontender and nondistended. Normal bowel sounds, without guarding, and without rebound.   Neurologic:  Alert and  oriented x4;  grossly normal neurologically.  Impression/Plan: James Bishop is now here to undergo a screening colonoscopy.  Risks, benefits, and alternatives regarding colonoscopy have been reviewed with the  patient.  Questions have been answered.  All parties agreeable.

## 2017-10-01 NOTE — Anesthesia Postprocedure Evaluation (Signed)
Anesthesia Post Note  Patient: James Bishop  Procedure(s) Performed: COLONOSCOPY WITH PROPOFOL (N/A ) POLYPECTOMY  Patient location during evaluation: PACU Anesthesia Type: General Level of consciousness: awake Pain management: pain level controlled Vital Signs Assessment: post-procedure vital signs reviewed and stable Respiratory status: respiratory function stable Cardiovascular status: stable Postop Assessment: no signs of nausea or vomiting Anesthetic complications: no    Veda Canning

## 2017-10-01 NOTE — Anesthesia Preprocedure Evaluation (Signed)
Anesthesia Evaluation  Patient identified by MRN, date of birth, ID band Patient awake    Reviewed: Allergy & Precautions, NPO status , Patient's Chart, lab work & pertinent test results  Airway Mallampati: II  TM Distance: >3 FB     Dental   Pulmonary    breath sounds clear to auscultation       Cardiovascular hypertension,  Rhythm:Regular Rate:Normal  HLD   Neuro/Psych Anxiety Depression    GI/Hepatic negative GI ROS, neg GERD  ,  Endo/Other  BMI 36  Renal/GU      Musculoskeletal   Abdominal   Peds  Hematology negative hematology ROS (+)   Anesthesia Other Findings   Reproductive/Obstetrics                             Anesthesia Physical Anesthesia Plan  ASA: II  Anesthesia Plan: General   Post-op Pain Management:    Induction: Intravenous  PONV Risk Score and Plan:   Airway Management Planned: Natural Airway  Additional Equipment:   Intra-op Plan:   Post-operative Plan:   Informed Consent: I have reviewed the patients History and Physical, chart, labs and discussed the procedure including the risks, benefits and alternatives for the proposed anesthesia with the patient or authorized representative who has indicated his/her understanding and acceptance.   Dental advisory given  Plan Discussed with: CRNA  Anesthesia Plan Comments:         Anesthesia Quick Evaluation

## 2017-10-01 NOTE — Op Note (Signed)
[facility] Patient Name: James Bishop Procedure Date: 10/01/2017 8:12 AM MRN: [id number] Account #: [id number] Date of Birth: November 29, 1960 Admit Type: Outpatient Age: 57 Room: [facility] OR ROOM 01 Gender: Male Note Status: Finalized Procedure:            Colonoscopy Indications:          Screening for colorectal malignant neoplasm Providers:            Lucilla Lame MD, MD Referring MD:         Juline Patch, MD (Referring MD) Medicines:            Propofol per Anesthesia Complications:        No immediate complications. Procedure:            Pre-Anesthesia Assessment:                       - Prior to the procedure, a History and Physical was                        performed, and patient medications and allergies were                        reviewed. The patient's tolerance of previous                        anesthesia was also reviewed. The risks and benefits of                        the procedure and the sedation options and risks were                        discussed with the patient. All questions were                        answered, and informed consent was obtained. Prior                        Anticoagulants: The patient has taken no previous                        anticoagulant or antiplatelet agents. ASA Grade                        Assessment: II - A patient with mild systemic disease.                        After reviewing the risks and benefits, the patient was                        deemed in satisfactory condition to undergo the                        procedure.                       After obtaining informed consent, the colonoscope was                        passed under direct vision. Throughout the procedure,  the patient's blood pressure, pulse, and oxygen                        saturations were monitored continuously. The                        Colonoscope was introduced through the anus and           advanced to the the cecum, identified by appendiceal                        orifice and ileocecal valve. The colonoscopy was                        performed without difficulty. The patient tolerated the                        procedure well. The quality of the bowel preparation                        was excellent. Findings:      The perianal and digital rectal examinations were normal.      A 3 mm polyp was found in the sigmoid colon. The polyp was sessile. The       polyp was removed with a cold biopsy forceps. Resection and retrieval       were complete.      A few small-mouthed diverticula were found in the sigmoid colon.      Non-bleeding internal hemorrhoids were found during retroflexion. The       hemorrhoids were Grade II (internal hemorrhoids that prolapse but reduce       spontaneously). Impression:           - One 3 mm polyp in the sigmoid colon, removed with a                        cold biopsy forceps. Resected and retrieved.                       - Diverticulosis in the sigmoid colon.                       - Non-bleeding internal hemorrhoids. Recommendation:       - Discharge patient to home.                       - Resume previous diet.                       - Continue present medications.                       - Repeat colonoscopy in 5 years if polyp adenoma and 10                        years if hyperplastic Procedure Code(s):    --- Professional ---                       [phone], Colonoscopy, flexible; with biopsy, single or                        multiple Diagnosis  Code(s):    --- Professional ---                       Z12.11, Encounter for screening for malignant neoplasm                        of colon                       D12.5, Benign neoplasm of sigmoid colon CPT copyright 2017 [employer]. All rights reserved. The codes documented in this report are preliminary and upon coder review may  be revised to meet current compliance  requirements. Lucilla Lame MD, MD 10/01/2017 8:41:42 AM This report has been signed electronically. Number of Addenda: 0 Note Initiated On: 10/01/2017 8:12 AM Scope Withdrawal Time: 0 hours 10 minutes 44 seconds  Total Procedure Duration: 0 hours 15 minutes 41 seconds       [facility]

## 2017-10-01 NOTE — Transfer of Care (Signed)
Immediate Anesthesia Transfer of Care Note  Patient: James Bishop  Procedure(s) Performed: COLONOSCOPY WITH PROPOFOL (N/A ) POLYPECTOMY  Patient Location: PACU  Anesthesia Type: General  Level of Consciousness: awake, alert  and patient cooperative  Airway and Oxygen Therapy: Patient Spontanous Breathing and Patient connected to supplemental oxygen  Post-op Assessment: Post-op Vital signs reviewed, Patient's Cardiovascular Status Stable, Respiratory Function Stable, Patent Airway and No signs of Nausea or vomiting  Post-op Vital Signs: Reviewed and stable  Complications: No apparent anesthesia complications

## 2017-10-02 ENCOUNTER — Encounter: Payer: Self-pay | Admitting: Gastroenterology

## 2017-10-05 ENCOUNTER — Encounter: Payer: Self-pay | Admitting: Gastroenterology

## 2018-01-23 ENCOUNTER — Other Ambulatory Visit: Payer: Self-pay | Admitting: Family Medicine

## 2018-01-23 DIAGNOSIS — E785 Hyperlipidemia, unspecified: Secondary | ICD-10-CM

## 2018-01-23 DIAGNOSIS — F419 Anxiety disorder, unspecified: Secondary | ICD-10-CM

## 2018-01-23 DIAGNOSIS — I10 Essential (primary) hypertension: Secondary | ICD-10-CM

## 2018-01-23 DIAGNOSIS — F329 Major depressive disorder, single episode, unspecified: Secondary | ICD-10-CM

## 2018-01-23 DIAGNOSIS — F3341 Major depressive disorder, recurrent, in partial remission: Secondary | ICD-10-CM

## 2018-02-01 ENCOUNTER — Other Ambulatory Visit: Payer: Self-pay | Admitting: Family Medicine

## 2018-02-01 DIAGNOSIS — F419 Anxiety disorder, unspecified: Secondary | ICD-10-CM

## 2018-02-01 DIAGNOSIS — F329 Major depressive disorder, single episode, unspecified: Secondary | ICD-10-CM

## 2018-02-01 DIAGNOSIS — F32A Depression, unspecified: Secondary | ICD-10-CM

## 2018-02-01 DIAGNOSIS — I10 Essential (primary) hypertension: Secondary | ICD-10-CM

## 2018-02-01 DIAGNOSIS — E785 Hyperlipidemia, unspecified: Secondary | ICD-10-CM

## 2018-03-31 ENCOUNTER — Encounter: Payer: 59 | Admitting: Family Medicine

## 2018-04-30 ENCOUNTER — Other Ambulatory Visit: Payer: Self-pay

## 2018-04-30 DIAGNOSIS — E785 Hyperlipidemia, unspecified: Secondary | ICD-10-CM

## 2018-04-30 DIAGNOSIS — F3341 Major depressive disorder, recurrent, in partial remission: Secondary | ICD-10-CM

## 2018-04-30 DIAGNOSIS — F419 Anxiety disorder, unspecified: Secondary | ICD-10-CM

## 2018-04-30 DIAGNOSIS — F329 Major depressive disorder, single episode, unspecified: Secondary | ICD-10-CM

## 2018-04-30 DIAGNOSIS — I10 Essential (primary) hypertension: Secondary | ICD-10-CM

## 2018-04-30 MED ORDER — GEMFIBROZIL 600 MG PO TABS: 600.0000 mg | ORAL_TABLET | Freq: Two times a day (BID) | ORAL | 0 refills | Status: DC

## 2018-04-30 MED ORDER — ESCITALOPRAM OXALATE 20 MG PO TABS: 20.0000 mg | ORAL_TABLET | Freq: Every day | ORAL | 0 refills | Status: DC

## 2018-04-30 MED ORDER — LISINOPRIL 30 MG PO TABS: 30.0000 mg | ORAL_TABLET | Freq: Every day | ORAL | 0 refills | Status: DC

## 2018-04-30 MED ORDER — HYDROCHLOROTHIAZIDE 25 MG PO TABS: 25.0000 mg | ORAL_TABLET | Freq: Every day | ORAL | 0 refills | Status: DC

## 2018-05-24 ENCOUNTER — Ambulatory Visit: Payer: 59 | Admitting: Family Medicine

## 2018-06-21 ENCOUNTER — Other Ambulatory Visit: Payer: Self-pay

## 2018-06-21 ENCOUNTER — Ambulatory Visit: Payer: BLUE CROSS/BLUE SHIELD | Admitting: Family Medicine

## 2018-06-21 ENCOUNTER — Encounter: Payer: Self-pay | Admitting: Family Medicine

## 2018-06-21 DIAGNOSIS — E785 Hyperlipidemia, unspecified: Secondary | ICD-10-CM

## 2018-06-21 DIAGNOSIS — F419 Anxiety disorder, unspecified: Secondary | ICD-10-CM | POA: Diagnosis not present

## 2018-06-21 DIAGNOSIS — F329 Major depressive disorder, single episode, unspecified: Secondary | ICD-10-CM | POA: Diagnosis not present

## 2018-06-21 DIAGNOSIS — I10 Essential (primary) hypertension: Secondary | ICD-10-CM | POA: Diagnosis not present

## 2018-06-21 MED ORDER — GEMFIBROZIL 600 MG PO TABS: 600.0000 mg | ORAL_TABLET | Freq: Two times a day (BID) | ORAL | 1 refills | Status: DC

## 2018-06-21 MED ORDER — FISH OIL 500 MG PO CAPS: 500.0000 mg | ORAL_CAPSULE | Freq: Every day | ORAL | 3 refills | Status: DC

## 2018-06-21 MED ORDER — HYDROCHLOROTHIAZIDE 25 MG PO TABS: 25.0000 mg | ORAL_TABLET | Freq: Every day | ORAL | 1 refills | Status: DC

## 2018-06-21 MED ORDER — LISINOPRIL 30 MG PO TABS: 30.0000 mg | ORAL_TABLET | Freq: Every day | ORAL | 1 refills | Status: DC

## 2018-06-21 MED ORDER — ESCITALOPRAM OXALATE 20 MG PO TABS: 20.0000 mg | ORAL_TABLET | Freq: Every day | ORAL | 1 refills | Status: DC

## 2018-06-21 NOTE — Progress Notes (Addendum)
Date:  06/21/2018   Name:  James Bishop   DOB:  1960-08-07   MRN:  [id number]   Chief Complaint: Hypertension and Depression  Hypertension  This is a chronic problem. The current episode started more than 1 year ago. The problem is controlled. Pertinent negatives include no anxiety, blurred vision, chest pain, headaches, malaise/fatigue, neck pain, orthopnea, palpitations, peripheral edema, PND, shortness of breath or sweats. There are no associated agents to hypertension. There are no known risk factors for coronary artery disease. Past treatments include ACE inhibitors and diuretics. The current treatment provides moderate improvement. There are no compliance problems.  There is no history of angina, kidney disease, CAD/MI, CVA, heart failure, left ventricular hypertrophy, PVD or retinopathy. There is no history of chronic renal disease, a hypertension causing med or renovascular disease.  Depression         The current episode started more than 1 year ago.   The onset quality is sudden.   The problem occurs intermittently.  The problem has been gradually improving since onset.  Associated symptoms include no decreased concentration, no fatigue, no helplessness, no hopelessness, does not have insomnia, not irritable, no restlessness, no decreased interest, no appetite change, no body aches, no myalgias, no headaches, no indigestion, not sad and no suicidal ideas.     The symptoms are aggravated by nothing.  Past treatments include SSRIs - Selective serotonin reuptake inhibitors.  Compliance with treatment is good.  Previous treatment provided mild relief.   Pertinent negatives include no hypothyroidism and no anxiety. Hyperlipidemia  This is a chronic problem. The current episode started more than 1 year ago. The problem is controlled. Recent lipid tests were reviewed and are normal. He has no history of chronic renal disease, diabetes, hypothyroidism, liver disease, obesity or nephrotic  syndrome. Factors aggravating his hyperlipidemia include thiazides. Pertinent negatives include no chest pain, focal sensory loss, focal weakness, leg pain, myalgias or shortness of breath. Current antihyperlipidemic treatment includes fibric acid derivatives. The current treatment provides moderate improvement of lipids. There are no compliance problems.  Risk factors for coronary artery disease include dyslipidemia.    Review of Systems  Constitutional: Negative for appetite change, chills, fatigue, fever and malaise/fatigue.  HENT: Negative for drooling, ear discharge, ear pain and sore throat.   Eyes: Negative for blurred vision.  Respiratory: Negative for cough, shortness of breath and wheezing.   Cardiovascular: Negative for chest pain, palpitations, orthopnea, leg swelling and PND.  Gastrointestinal: Negative for abdominal pain, blood in stool, constipation, diarrhea and nausea.  Endocrine: Negative for polydipsia.  Genitourinary: Negative for dysuria, frequency, hematuria and urgency.  Musculoskeletal: Negative for back pain, myalgias and neck pain.  Skin: Negative for rash.  Allergic/Immunologic: Negative for environmental allergies.  Neurological: Negative for dizziness, focal weakness and headaches.  Hematological: Does not bruise/bleed easily.  Psychiatric/Behavioral: Positive for depression. Negative for decreased concentration and suicidal ideas. The patient is not nervous/anxious and does not have insomnia.     Patient Active Problem List   Diagnosis Date Noted  . Polyp of sigmoid colon   . Class 2 severe obesity due to excess calories with serious comorbidity and body mass index (BMI) of 38.0 to 38.9 in adult ([place]) 09/21/2017  . Recurrent major depressive disorder, in partial remission ([place]) 08/18/2016  . Encounter for screening colonoscopy 09/27/2014  . Anxiety and depression 09/27/2014  . Dyslipidemia 09/27/2014  . Hypertension 09/27/2014    Allergies  Allergen  Reactions  . Bee Venom   .  Penicillins Rash and Other (See Comments)    fever    Past Surgical History:  Procedure Laterality Date  . COLONOSCOPY WITH PROPOFOL N/A 10/01/2017   Procedure: COLONOSCOPY WITH PROPOFOL;  Surgeon: Lucilla Lame, MD;  Location: [facility];  Service: Endoscopy;  Laterality: N/A;  . POLYPECTOMY  10/01/2017   Procedure: POLYPECTOMY;  Surgeon: Lucilla Lame, MD;  Location: [facility];  Service: Endoscopy;;    Social History   Tobacco Use  . Smoking status: Never Smoker  . Smokeless tobacco: Never Used  Substance Use Topics  . Alcohol use: Not Currently    Alcohol/week: 0.0 standard drinks  . Drug use: No     Medication list has been reviewed and updated.  Current Meds  Medication Sig  . escitalopram (LEXAPRO) 20 MG tablet TAKE 1 TABLET BY MOUTH EVERY DAY  . gemfibrozil (LOPID) 600 MG tablet TAKE 1 TABLET BY MOUTH TWICE DAILY  . hydrochlorothiazide (HYDRODIURIL) 25 MG tablet TAKE 1 TABLET BY MOUTH EVERY DAY  . lisinopril (PRINIVIL,ZESTRIL) 30 MG tablet TAKE 1 TABLET BY MOUTH EVERY DAY  . Omega-3 Fatty Acids (FISH OIL) 500 MG CAPS Take by mouth daily.  . [DISCONTINUED] escitalopram (LEXAPRO) 20 MG tablet Take 1 tablet (20 mg total) by mouth daily.  . [DISCONTINUED] gemfibrozil (LOPID) 600 MG tablet Take 1 tablet (600 mg total) by mouth 2 (two) times daily.  . [DISCONTINUED] hydrochlorothiazide (HYDRODIURIL) 25 MG tablet Take 1 tablet (25 mg total) by mouth daily.  . [DISCONTINUED] lisinopril (PRINIVIL,ZESTRIL) 30 MG tablet Take 1 tablet (30 mg total) by mouth daily.    PHQ 2/9 Scores 06/21/2018 09/21/2017 06/08/2015 09/28/2014  PHQ - 2 Score 1 0 2 0  PHQ- 9 Score 1 0 3 -    Physical Exam Vitals signs and nursing note reviewed.  Constitutional:      General: He is not irritable. HENT:     Head: Normocephalic.     Right Ear: External ear normal.     Left Ear: External ear normal.     Nose: Nose normal.  Eyes:     General: No scleral  icterus.       Right eye: No discharge.        Left eye: No discharge.     Conjunctiva/sclera: Conjunctivae normal.     Pupils: Pupils are equal, round, and reactive to light.  Neck:     Musculoskeletal: Normal range of motion and neck supple.     Thyroid: No thyromegaly.     Vascular: No JVD.     Trachea: No tracheal deviation.  Cardiovascular:     Rate and Rhythm: Normal rate and regular rhythm.     Chest Wall: PMI is not displaced.     Pulses:          Carotid pulses are 2+ on the right side and 2+ on the left side.      Radial pulses are 2+ on the right side and 2+ on the left side.       Femoral pulses are 2+ on the right side and 2+ on the left side.      Popliteal pulses are 2+ on the right side and 2+ on the left side.       Dorsalis pedis pulses are 2+ on the right side and 2+ on the left side.       Posterior tibial pulses are 2+ on the right side and 2+ on the left side.     Heart sounds:  Normal heart sounds, S1 normal and S2 normal. Heart sounds not distant. No murmur. No systolic murmur. No diastolic murmur. No friction rub. No gallop. No S3 or S4 sounds.   Pulmonary:     Effort: No respiratory distress.     Breath sounds: Normal breath sounds. No wheezing or rales.  Abdominal:     General: Bowel sounds are normal.     Palpations: Abdomen is soft. There is no mass.     Tenderness: There is no abdominal tenderness. There is no guarding or rebound.  Musculoskeletal: Normal range of motion.        General: No tenderness.     Right lower leg: No edema.     Left lower leg: No edema.  Lymphadenopathy:     Cervical: No cervical adenopathy.  Skin:    General: Skin is warm.     Findings: No rash.  Neurological:     Mental Status: He is alert and oriented to person, place, and time.     Cranial Nerves: No cranial nerve deficit.     Deep Tendon Reflexes: Reflexes are normal and symmetric.     BP 119/79   Pulse 68   Resp 16   Ht 6\' 1"  (1.854 m)   Wt 274 lb (124.3 kg)    SpO2 98%   BMI 36.15 kg/m   Assessment 1 controlled.  Continue S-Citalopram anxiety and depression Chronic.  Controlled.  Continue citalopram 20 mg once a day- escitalopram (LEXAPRO) 20 MG tablet; Take 1 tablet (20 mg total) by mouth daily.  Dispense: 90 tablet; Refill: 1  2. Dyslipidemia Chronic.  Controlled.  Continue both gemfibrozil 600 mg twice a day and - gemfibrozil (LOPID) 600 MG tablet; Take 1 tablet (600 mg total) by mouth 2 (two) times daily.  Dispense: 180 tablet; Refill: 1  3. Essential hypertension controlled.  Chronic. Continue hydrochlorothiazide 25 mg once a day and lisinopril 30 mg once a day..- hydrochlorothiazide (HYDRODIURIL) 25 MG tablet; Take 1 tablet (25 mg total) by mouth daily.  Dispense: 90 tablet; Refill: 1 - lisinopril (PRINIVIL,ZESTRIL) 30 MG tablet; Take 1 tablet (30 mg total) by mouth daily.  Dispense: 90 tablet; Refill: 1

## 2018-12-20 ENCOUNTER — Ambulatory Visit: Payer: BLUE CROSS/BLUE SHIELD | Admitting: Family Medicine

## 2019-01-17 ENCOUNTER — Ambulatory Visit: Payer: BC Managed Care – PPO | Admitting: Family Medicine

## 2019-01-19 ENCOUNTER — Ambulatory Visit: Payer: BC Managed Care – PPO | Admitting: Family Medicine

## 2019-02-01 ENCOUNTER — Encounter: Payer: Self-pay | Admitting: Family Medicine

## 2019-02-01 ENCOUNTER — Ambulatory Visit: Payer: BC Managed Care – PPO | Admitting: Family Medicine

## 2019-02-01 ENCOUNTER — Other Ambulatory Visit: Payer: Self-pay

## 2019-02-01 VITALS — BP 120/80 | HR 80 | Ht 73.0 in | Wt 274.0 lb

## 2019-02-01 DIAGNOSIS — E785 Hyperlipidemia, unspecified: Secondary | ICD-10-CM

## 2019-02-01 DIAGNOSIS — Z23 Encounter for immunization: Secondary | ICD-10-CM | POA: Diagnosis not present

## 2019-02-01 DIAGNOSIS — I10 Essential (primary) hypertension: Secondary | ICD-10-CM | POA: Diagnosis not present

## 2019-02-01 DIAGNOSIS — F419 Anxiety disorder, unspecified: Secondary | ICD-10-CM

## 2019-02-01 DIAGNOSIS — F329 Major depressive disorder, single episode, unspecified: Secondary | ICD-10-CM

## 2019-02-01 DIAGNOSIS — E66812 Obesity, class 2: Secondary | ICD-10-CM

## 2019-02-01 DIAGNOSIS — F32A Depression, unspecified: Secondary | ICD-10-CM

## 2019-02-01 DIAGNOSIS — F3341 Major depressive disorder, recurrent, in partial remission: Secondary | ICD-10-CM

## 2019-02-01 DIAGNOSIS — Z6838 Body mass index (BMI) 38.0-38.9, adult: Secondary | ICD-10-CM

## 2019-02-01 DIAGNOSIS — R69 Illness, unspecified: Secondary | ICD-10-CM

## 2019-02-01 MED ORDER — HYDROCHLOROTHIAZIDE 25 MG PO TABS: 25.0000 mg | ORAL_TABLET | Freq: Every day | ORAL | 1 refills | Status: DC

## 2019-02-01 MED ORDER — GEMFIBROZIL 600 MG PO TABS: 600.0000 mg | ORAL_TABLET | Freq: Two times a day (BID) | ORAL | 1 refills | Status: DC

## 2019-02-01 MED ORDER — LISINOPRIL 30 MG PO TABS: 30.0000 mg | ORAL_TABLET | Freq: Every day | ORAL | 1 refills | Status: DC

## 2019-02-01 MED ORDER — ESCITALOPRAM OXALATE 20 MG PO TABS: 20.0000 mg | ORAL_TABLET | Freq: Every day | ORAL | 1 refills | Status: DC

## 2019-02-01 NOTE — Progress Notes (Signed)
Date:  02/01/2019   Name:  James Bishop   DOB:  [birth year]   MRN:  [id number]   Chief Complaint: Hypertension, Hyperlipidemia, Depression (PHQ9=0 and GAD7=1), and influenza vacc need  Hypertension This is a chronic problem. The current episode started more than 1 year ago. The problem is unchanged. The problem is controlled. Pertinent negatives include no anxiety, blurred vision, chest pain, headaches, malaise/fatigue, neck pain, orthopnea, palpitations, peripheral edema, PND, shortness of breath or sweats. There are no associated agents to hypertension. Risk factors for coronary artery disease include dyslipidemia. Past treatments include ACE inhibitors and diuretics. The current treatment provides moderate improvement. There are no compliance problems.  There is no history of angina, kidney disease, CAD/MI, CVA, heart failure, left ventricular hypertrophy, PVD or retinopathy. There is no history of chronic renal disease, a hypertension causing med or renovascular disease.  Hyperlipidemia This is a chronic problem. The current episode started more than 1 year ago. The problem is controlled. Recent lipid tests were reviewed and are normal. He has no history of chronic renal disease, diabetes, hypothyroidism, liver disease, obesity or nephrotic syndrome. There are no known factors aggravating his hyperlipidemia. Pertinent negatives include no chest pain, focal sensory loss, focal weakness, leg pain, myalgias or shortness of breath. Current antihyperlipidemic treatment includes statins (omega 3). The current treatment provides mild improvement of lipids. There are no compliance problems.  Risk factors for coronary artery disease include hypertension and dyslipidemia.  Depression        The current episode started more than 1 year ago.   The onset quality is gradual.   The problem occurs intermittently.  The problem has been gradually improving since onset.  Associated symptoms include no  myalgias, no headaches and no suicidal ideas.   Pertinent negatives include no hypothyroidism and no anxiety.   Review of Systems  Constitutional: Negative for chills, fever and malaise/fatigue.  HENT: Negative for drooling, ear discharge, ear pain and sore throat.   Eyes: Negative for blurred vision.  Respiratory: Negative for cough, shortness of breath and wheezing.   Cardiovascular: Negative for chest pain, palpitations, orthopnea, leg swelling and PND.  Gastrointestinal: Negative for abdominal pain, blood in stool, constipation, diarrhea and nausea.  Endocrine: Negative for polydipsia.  Genitourinary: Negative for dysuria, frequency, hematuria and urgency.  Musculoskeletal: Negative for back pain, myalgias and neck pain.  Skin: Negative for rash.  Allergic/Immunologic: Negative for environmental allergies.  Neurological: Negative for dizziness, focal weakness and headaches.  Hematological: Does not bruise/bleed easily.  Psychiatric/Behavioral: Positive for depression. Negative for suicidal ideas. The patient is not nervous/anxious.     Patient Active Problem List   Diagnosis Date Noted  . Polyp of sigmoid colon   . Class 2 severe obesity due to excess calories with serious comorbidity and body mass index (BMI) of 38.0 to 38.9 in adult () 09/21/2017  . Recurrent major depressive disorder, in partial remission ([place]) 08/18/2016  . Encounter for screening colonoscopy 09/27/2014  . Anxiety and depression 09/27/2014  . Dyslipidemia 09/27/2014  . Hypertension 09/27/2014    Allergies  Allergen Reactions  . Bee Venom   . Penicillins Rash and Other (See Comments)    fever    Past Surgical History:  Procedure Laterality Date  . COLONOSCOPY WITH PROPOFOL N/A 10/01/2017   Procedure: COLONOSCOPY WITH PROPOFOL;  Surgeon: Lucilla Lame, MD;  Location: [facility];  Service: Endoscopy;  Laterality: N/A;  . POLYPECTOMY  10/01/2017   Procedure: POLYPECTOMY;  Surgeon: Lucilla Lame,  MD;  Location: [facility];  Service: Endoscopy;;    Social History   Tobacco Use  . Smoking status: Never Smoker  . Smokeless tobacco: Never Used  Substance Use Topics  . Alcohol use: Not Currently    Alcohol/week: 0.0 standard drinks  . Drug use: No     Medication list has been reviewed and updated.  Current Meds  Medication Sig  . escitalopram (LEXAPRO) 20 MG tablet Take 1 tablet (20 mg total) by mouth daily.  Marland Kitchen gemfibrozil (LOPID) 600 MG tablet Take 1 tablet (600 mg total) by mouth 2 (two) times daily.  . hydrochlorothiazide (HYDRODIURIL) 25 MG tablet Take 1 tablet (25 mg total) by mouth daily.  Marland Kitchen lisinopril (PRINIVIL,ZESTRIL) 30 MG tablet Take 1 tablet (30 mg total) by mouth daily.  . Omega-3 Fatty Acids (FISH OIL) 500 MG CAPS Take 1 capsule (500 mg total) by mouth daily.    PHQ 2/9 Scores 02/01/2019 06/21/2018 09/21/2017 06/08/2015  PHQ - 2 Score 0 1 0 2  PHQ- 9 Score 0 1 0 3    BP Readings from Last 3 Encounters:  02/01/19 120/80  06/21/18 119/79  10/01/17 119/76    Physical Exam Vitals signs and nursing note reviewed.  HENT:     Head: Normocephalic.     Right Ear: Tympanic membrane, ear canal and external ear normal.     Left Ear: Tympanic membrane, ear canal and external ear normal.     Nose: Nose normal. No congestion or rhinorrhea.     Mouth/Throat:     Mouth: Mucous membranes are moist.  Eyes:     General: No scleral icterus.       Right eye: No discharge.        Left eye: No discharge.     Conjunctiva/sclera: Conjunctivae normal.     Pupils: Pupils are equal, round, and reactive to light.  Neck:     Musculoskeletal: Normal range of motion and neck supple.     Thyroid: No thyromegaly.     Vascular: No JVD.     Trachea: No tracheal deviation.  Cardiovascular:     Rate and Rhythm: Normal rate and regular rhythm.     Pulses: Normal pulses.     Heart sounds: Normal heart sounds. No murmur. No friction rub. No gallop.   Pulmonary:     Effort: No  respiratory distress.     Breath sounds: Normal breath sounds. No wheezing, rhonchi or rales.  Abdominal:     General: Bowel sounds are normal.     Palpations: Abdomen is soft. There is no mass.     Tenderness: There is no abdominal tenderness. There is no guarding or rebound.  Musculoskeletal: Normal range of motion.        General: No tenderness.  Lymphadenopathy:     Cervical: No cervical adenopathy.  Skin:    General: Skin is warm.     Findings: No rash.  Neurological:     Mental Status: He is alert and oriented to person, place, and time.     Cranial Nerves: No cranial nerve deficit.     Deep Tendon Reflexes: Reflexes are normal and symmetric.     Wt Readings from Last 3 Encounters:  02/01/19 274 lb (124.3 kg)  06/21/18 274 lb (124.3 kg)  10/01/17 274 lb (124.3 kg)    BP 120/80   Pulse 80   Ht 6\' 1"  (1.854 m)   Wt 274 lb (124.3 kg)   BMI 36.15 kg/m  Assessment and Plan: 1. Anxiety and depression PHQ 0 GAD 1 this is stable.  Chronic.  Controlled.  Continue S-Citalopram 20 mg once a day.  Will recheck in 6 months - escitalopram (LEXAPRO) 20 MG tablet; Take 1 tablet (20 mg total) by mouth daily.  Dispense: 90 tablet; Refill: 1  2. Dyslipidemia Patient with a history of elevated triglyceride.  This is controlled.  Chronic.  Continue gemfibrozil 600 mg twice a day.  Will check lipid panel. - Lipid Panel With LDL/HDL Ratio - gemfibrozil (LOPID) 600 MG tablet; Take 1 tablet (600 mg total) by mouth 2 (two) times daily.  Dispense: 180 tablet; Refill: 1  3. Essential hypertension Chronic.  Controlled.  Stable.  Continue hydrochlorothiazide 25 mg and lisinopril 30 mg daily. - Renal Function Panel - hydrochlorothiazide (HYDRODIURIL) 25 MG tablet; Take 1 tablet (25 mg total) by mouth daily.  Dispense: 90 tablet; Refill: 1 - lisinopril (ZESTRIL) 30 MG tablet; Take 1 tablet (30 mg total) by mouth daily.  Dispense: 90 tablet; Refill: 1  4. Taking medication for chronic disease  Patient taking medications and we will monitor for hepatotoxicity. - Hepatic function panel  5. Influenza vaccine needed Discussed and administered.  6.Obesity-BMI 36.15.  Weight loss discussed.Health risks of being over weight were discussed and patient was counseled on weight loss options and exercise.

## 2019-02-02 LAB — HEPATIC FUNCTION PANEL
ALT: 25 IU/L (ref 0–44)
AST: 20 IU/L (ref 0–40)
Alkaline Phosphatase: 102 IU/L (ref 39–117)
Bilirubin Total: 0.7 mg/dL (ref 0.0–1.2)
Bilirubin, Direct: 0.18 mg/dL (ref 0.00–0.40)
Total Protein: 7.2 g/dL (ref 6.0–8.5)

## 2019-02-02 LAB — LIPID PANEL WITH LDL/HDL RATIO
Cholesterol, Total: 180 mg/dL (ref 100–199)
HDL: 38 mg/dL — ABNORMAL LOW (ref >39)
LDL Chol Calc (NIH): 123 mg/dL — ABNORMAL HIGH (ref 0–99)
LDL/HDL Ratio: 3.2 ratio (ref 0.0–3.6)
Triglycerides: 103 mg/dL (ref 0–149)
VLDL Cholesterol Cal: 19 mg/dL (ref 5–40)

## 2019-02-02 LAB — RENAL FUNCTION PANEL
Albumin: 4.8 g/dL (ref 3.8–4.9)
BUN/Creatinine Ratio: 20 (ref 9–20)
BUN: 19 mg/dL (ref 6–24)
CO2: 22 mmol/L (ref 20–29)
Calcium: 9.7 mg/dL (ref 8.7–10.2)
Chloride: 102 mmol/L (ref 96–106)
Creatinine, Ser: 0.96 mg/dL (ref 0.76–1.27)
GFR calc Af Amer: 101 mL/min/{1.73_m2} (ref >59)
GFR calc non Af Amer: 87 mL/min/{1.73_m2} (ref >59)
Glucose: 97 mg/dL (ref 65–99)
Phosphorus: 3.6 mg/dL (ref 2.8–4.1)
Potassium: 4.5 mmol/L (ref 3.5–5.2)
Sodium: 138 mmol/L (ref 134–144)

## 2019-04-28 DIAGNOSIS — Z20828 Contact with and (suspected) exposure to other viral communicable diseases: Secondary | ICD-10-CM | POA: Diagnosis not present

## 2019-06-24 ENCOUNTER — Other Ambulatory Visit: Payer: Self-pay | Admitting: Family Medicine

## 2019-06-24 DIAGNOSIS — F329 Major depressive disorder, single episode, unspecified: Secondary | ICD-10-CM

## 2019-06-24 DIAGNOSIS — F419 Anxiety disorder, unspecified: Secondary | ICD-10-CM

## 2019-06-24 DIAGNOSIS — F32A Depression, unspecified: Secondary | ICD-10-CM

## 2019-08-08 ENCOUNTER — Ambulatory Visit: Payer: BC Managed Care – PPO | Admitting: Family Medicine

## 2019-08-10 ENCOUNTER — Telehealth: Payer: Self-pay | Admitting: Family Medicine

## 2019-08-10 NOTE — Telephone Encounter (Signed)
Copied from [place] [phone] [phone]. Topic: General - Inquiry >> Aug 10, 2019 12:10 PM Percell Belt A wrote: Reason for CRM: pt called and would like to speak with Dr Ronnald Ramp nurse.  He said he had a couple of general question about his prescription , he would not provide any other details  Best number [phone]

## 2019-08-11 ENCOUNTER — Other Ambulatory Visit: Payer: Self-pay

## 2019-08-11 DIAGNOSIS — I10 Essential (primary) hypertension: Secondary | ICD-10-CM

## 2019-08-11 DIAGNOSIS — E785 Hyperlipidemia, unspecified: Secondary | ICD-10-CM

## 2019-08-11 DIAGNOSIS — F419 Anxiety disorder, unspecified: Secondary | ICD-10-CM

## 2019-08-11 DIAGNOSIS — F329 Major depressive disorder, single episode, unspecified: Secondary | ICD-10-CM

## 2019-08-11 MED ORDER — HYDROCHLOROTHIAZIDE 25 MG PO TABS: 25.0000 mg | ORAL_TABLET | Freq: Every day | ORAL | 0 refills | Status: DC

## 2019-08-11 MED ORDER — ESCITALOPRAM OXALATE 20 MG PO TABS: 20.0000 mg | ORAL_TABLET | Freq: Every day | ORAL | 0 refills | Status: DC

## 2019-08-11 MED ORDER — LISINOPRIL 30 MG PO TABS: 30.0000 mg | ORAL_TABLET | Freq: Every day | ORAL | 0 refills | Status: DC

## 2019-08-11 MED ORDER — GEMFIBROZIL 600 MG PO TABS: 600.0000 mg | ORAL_TABLET | Freq: Two times a day (BID) | ORAL | 0 refills | Status: DC

## 2019-08-11 NOTE — Progress Notes (Unsigned)
Sent in meds x 30 days

## 2019-08-15 ENCOUNTER — Ambulatory Visit: Payer: Self-pay | Admitting: Family Medicine

## 2019-08-22 ENCOUNTER — Encounter: Payer: Self-pay | Admitting: Family Medicine

## 2019-08-22 ENCOUNTER — Ambulatory Visit (INDEPENDENT_AMBULATORY_CARE_PROVIDER_SITE_OTHER): Payer: Self-pay | Admitting: Family Medicine

## 2019-08-22 ENCOUNTER — Other Ambulatory Visit: Payer: Self-pay

## 2019-08-22 DIAGNOSIS — E785 Hyperlipidemia, unspecified: Secondary | ICD-10-CM

## 2019-08-22 DIAGNOSIS — F329 Major depressive disorder, single episode, unspecified: Secondary | ICD-10-CM

## 2019-08-22 DIAGNOSIS — F419 Anxiety disorder, unspecified: Secondary | ICD-10-CM

## 2019-08-22 DIAGNOSIS — I10 Essential (primary) hypertension: Secondary | ICD-10-CM

## 2019-08-22 MED ORDER — HYDROCHLOROTHIAZIDE 25 MG PO TABS: 25.0000 mg | ORAL_TABLET | Freq: Every day | ORAL | 1 refills | Status: DC

## 2019-08-22 MED ORDER — LISINOPRIL 30 MG PO TABS: 30.0000 mg | ORAL_TABLET | Freq: Every day | ORAL | 1 refills | Status: DC

## 2019-08-22 MED ORDER — ESCITALOPRAM OXALATE 20 MG PO TABS: 20.0000 mg | ORAL_TABLET | Freq: Every day | ORAL | 1 refills | Status: DC

## 2019-08-22 MED ORDER — GEMFIBROZIL 600 MG PO TABS: 600.0000 mg | ORAL_TABLET | Freq: Two times a day (BID) | ORAL | 1 refills | Status: DC

## 2019-08-22 MED ORDER — FISH OIL 500 MG PO CAPS: 500.0000 mg | ORAL_CAPSULE | Freq: Every day | ORAL | 3 refills | Status: AC

## 2019-08-22 NOTE — Progress Notes (Signed)
Date:  08/22/2019   Name:  James Bishop   DOB:  03-27-1961   MRN:  [id number]   Chief Complaint: Depression (PHQ9=2 and GAD7=3), Hyperlipidemia, and Hypertension  Depression        This is a chronic problem.  The current episode started more than 1 year ago.   The onset quality is gradual.   The problem has been gradually improving since onset.  Associated symptoms include sad.  Associated symptoms include no decreased concentration, no fatigue, no helplessness, no hopelessness, does not have insomnia, not irritable, no restlessness, no decreased interest, no appetite change, no body aches, no myalgias, no headaches, no indigestion and no suicidal ideas.     The symptoms are aggravated by nothing.  Past treatments include SSRIs - Selective serotonin reuptake inhibitors.  Compliance with treatment is variable.  Previous treatment provided moderate relief.   Pertinent negatives include no hypothyroidism and no anxiety. Hyperlipidemia This is a chronic problem. The current episode started more than 1 year ago. The problem is controlled. Recent lipid tests were reviewed and are normal. He has no history of chronic renal disease, diabetes, hypothyroidism, liver disease, obesity or nephrotic syndrome. Pertinent negatives include no chest pain, focal sensory loss, focal weakness, leg pain, myalgias or shortness of breath. Current antihyperlipidemic treatment includes fibric acid derivatives (omega 3). The current treatment provides moderate improvement of lipids. There are no compliance problems.   Hypertension This is a chronic problem. The current episode started more than 1 year ago. The problem has been gradually improving since onset. The problem is controlled. Pertinent negatives include no anxiety, blurred vision, chest pain, headaches, malaise/fatigue, neck pain, orthopnea, palpitations, peripheral edema, PND, shortness of breath or sweats. Past treatments include ACE inhibitors and diuretics.  The current treatment provides moderate improvement. There are no compliance problems.  There is no history of angina, kidney disease, CAD/MI, CVA, heart failure, left ventricular hypertrophy, PVD or retinopathy. There is no history of chronic renal disease, a hypertension causing med or renovascular disease.    Lab Results  Component Value Date   CREATININE 0.96 02/01/2019   BUN 19 02/01/2019   NA 138 02/01/2019   K 4.5 02/01/2019   CL 102 02/01/2019   CO2 22 02/01/2019   Lab Results  Component Value Date   CHOL 180 02/01/2019   HDL 38 (L) 02/01/2019   LDLCALC 123 (H) 02/01/2019   TRIG 103 02/01/2019   CHOLHDL 4.8 08/18/2016   No results found for: TSH No results found for: HGBA1C No results found for: WBC, HGB, HCT, MCV, PLT Lab Results  Component Value Date   ALT 25 02/01/2019   AST 20 02/01/2019   ALKPHOS 102 02/01/2019   BILITOT 0.7 02/01/2019     Review of Systems  Constitutional: Negative for appetite change, chills, fatigue, fever and malaise/fatigue.  HENT: Negative for drooling, ear discharge, ear pain and sore throat.   Eyes: Negative for blurred vision.  Respiratory: Negative for cough, shortness of breath and wheezing.   Cardiovascular: Negative for chest pain, palpitations, orthopnea, leg swelling and PND.  Gastrointestinal: Negative for abdominal pain, blood in stool, constipation, diarrhea and nausea.  Endocrine: Negative for polydipsia.  Genitourinary: Negative for dysuria, frequency, hematuria and urgency.  Musculoskeletal: Negative for back pain, myalgias and neck pain.  Skin: Negative for rash.  Allergic/Immunologic: Negative for environmental allergies.  Neurological: Negative for dizziness, focal weakness and headaches.  Hematological: Does not bruise/bleed easily.  Psychiatric/Behavioral: Positive for depression. Negative for  decreased concentration and suicidal ideas. The patient is not nervous/anxious and does not have insomnia.     Patient  Active Problem List   Diagnosis Date Noted  . Polyp of sigmoid colon   . Class 2 severe obesity due to excess calories with serious comorbidity and body mass index (BMI) of 38.0 to 38.9 in adult ([place]) 09/21/2017  . Recurrent major depressive disorder, in partial remission ([place]) 08/18/2016  . Encounter for screening colonoscopy 09/27/2014  . Anxiety and depression 09/27/2014  . Dyslipidemia 09/27/2014  . Hypertension 09/27/2014    Allergies  Allergen Reactions  . Bee Venom   . Penicillins Rash and Other (See Comments)    fever    Past Surgical History:  Procedure Laterality Date  . COLONOSCOPY WITH PROPOFOL N/A 10/01/2017   Procedure: COLONOSCOPY WITH PROPOFOL;  Surgeon: Lucilla Lame, MD;  Location: [place];  Service: Endoscopy;  Laterality: N/A;  . POLYPECTOMY  10/01/2017   Procedure: POLYPECTOMY;  Surgeon: Lucilla Lame, MD;  Location: [facility];  Service: Endoscopy;;    Social History   Tobacco Use  . Smoking status: Never Smoker  . Smokeless tobacco: Never Used  Substance Use Topics  . Alcohol use: Not Currently    Alcohol/week: 0.0 standard drinks  . Drug use: No     Medication list has been reviewed and updated.  Current Meds  Medication Sig  . escitalopram (LEXAPRO) 20 MG tablet Take 1 tablet (20 mg total) by mouth daily.  Marland Kitchen gemfibrozil (LOPID) 600 MG tablet Take 1 tablet (600 mg total) by mouth 2 (two) times daily.  . hydrochlorothiazide (HYDRODIURIL) 25 MG tablet Take 1 tablet (25 mg total) by mouth daily.  Marland Kitchen lisinopril (ZESTRIL) 30 MG tablet Take 1 tablet (30 mg total) by mouth daily.  . Omega-3 Fatty Acids (FISH OIL) 500 MG CAPS Take 1 capsule (500 mg total) by mouth daily.    PHQ 2/9 Scores 08/22/2019 02/01/2019 06/21/2018 09/21/2017  PHQ - 2 Score 2 0 1 0  PHQ- 9 Score 2 0 1 0    BP Readings from Last 3 Encounters:  08/22/19 130/60  02/01/19 120/80  06/21/18 119/79    Physical Exam Vitals and nursing note reviewed.    Constitutional:      General: He is not irritable. HENT:     Head: Normocephalic.     Right Ear: Tympanic membrane, ear canal and external ear normal.     Left Ear: Tympanic membrane, ear canal and external ear normal.     Nose: Nose normal. No congestion or rhinorrhea.     Mouth/Throat:     Pharynx: Oropharynx is clear.  Eyes:     General: No scleral icterus.       Right eye: No discharge.        Left eye: No discharge.     Conjunctiva/sclera: Conjunctivae normal.     Pupils: Pupils are equal, round, and reactive to light.  Neck:     Thyroid: No thyromegaly.     Vascular: No JVD.     Trachea: No tracheal deviation.  Cardiovascular:     Rate and Rhythm: Normal rate and regular rhythm.     Heart sounds: Normal heart sounds. No murmur. No friction rub. No gallop.   Pulmonary:     Effort: No respiratory distress.     Breath sounds: Normal breath sounds. No wheezing, rhonchi or rales.  Abdominal:     General: Bowel sounds are normal.     Palpations: Abdomen  is soft. There is no mass.     Tenderness: There is no abdominal tenderness. There is no guarding or rebound.  Musculoskeletal:        General: No tenderness. Normal range of motion.     Cervical back: Normal range of motion and neck supple.  Lymphadenopathy:     Cervical: No cervical adenopathy.  Skin:    General: Skin is warm.     Findings: No rash.  Neurological:     Mental Status: He is alert and oriented to person, place, and time.     Cranial Nerves: No cranial nerve deficit.     Deep Tendon Reflexes: Reflexes are normal and symmetric.     Wt Readings from Last 3 Encounters:  08/22/19 282 lb (127.9 kg)  02/01/19 274 lb (124.3 kg)  06/21/18 274 lb (124.3 kg)    BP 130/60   Pulse 64   Ht 6\' 1"  (1.854 m)   Wt 282 lb (127.9 kg)   BMI 37.21 kg/m   Assessment and Plan:  1. Anxiety and depression Chronic.  Controlled.  Stable.  PHQ 2 Gad score 3.  Continue S-Citalopram 20 mg once a day.  We will recheck  patient in 6 months. - escitalopram (LEXAPRO) 20 MG tablet; Take 1 tablet (20 mg total) by mouth daily.  Dispense: 90 tablet; Refill: 1  2. Dyslipidemia .  Controlled.  Stable.  Patient will continue on gemfibrozil 600 mg twice a day.  He will also continue his over-the-counter omega-3 500 mg daily.  Reviewed previous lipid panel which is in normal limits and will recheck in 6 months. - gemfibrozil (LOPID) 600 MG tablet; Take 1 tablet (600 mg total) by mouth 2 (two) times daily.  Dispense: 180 tablet; Refill: 1 - Omega-3 Fatty Acids (FISH OIL) 500 MG CAPS; Take 1 capsule (500 mg total) by mouth daily.  Dispense: 100 capsule; Refill: 3  3. Essential hypertension Chronic.  Controlled.  Stable.  Continue hydrochlorothiazide 25 mg 1 a day as well as lisinopril 30 mg once a day.  Reviewed patient's previous renal panel which is well in normal range.  We will recheck renal panel in 6 months. - hydrochlorothiazide (HYDRODIURIL) 25 MG tablet; Take 1 tablet (25 mg total) by mouth daily.  Dispense: 90 tablet; Refill: 1 - lisinopril (ZESTRIL) 30 MG tablet; Take 1 tablet (30 mg total) by mouth daily.  Dispense: 90 tablet; Refill: 1

## 2019-12-01 ENCOUNTER — Encounter: Payer: Self-pay | Admitting: Family Medicine

## 2019-12-02 ENCOUNTER — Other Ambulatory Visit: Payer: Self-pay

## 2019-12-02 DIAGNOSIS — G4730 Sleep apnea, unspecified: Secondary | ICD-10-CM

## 2019-12-02 NOTE — Progress Notes (Unsigned)
Placed referral to ENT

## 2019-12-05 DIAGNOSIS — R0683 Snoring: Secondary | ICD-10-CM | POA: Diagnosis not present

## 2019-12-17 DIAGNOSIS — G4730 Sleep apnea, unspecified: Secondary | ICD-10-CM | POA: Diagnosis not present

## 2020-02-08 DIAGNOSIS — G4733 Obstructive sleep apnea (adult) (pediatric): Secondary | ICD-10-CM | POA: Diagnosis not present

## 2020-02-17 DIAGNOSIS — G4733 Obstructive sleep apnea (adult) (pediatric): Secondary | ICD-10-CM | POA: Diagnosis not present

## 2020-02-27 ENCOUNTER — Ambulatory Visit: Payer: Self-pay | Admitting: Family Medicine

## 2020-03-10 DIAGNOSIS — G4733 Obstructive sleep apnea (adult) (pediatric): Secondary | ICD-10-CM | POA: Diagnosis not present

## 2020-04-02 ENCOUNTER — Other Ambulatory Visit: Payer: Self-pay | Admitting: Family Medicine

## 2020-04-02 DIAGNOSIS — G4733 Obstructive sleep apnea (adult) (pediatric): Secondary | ICD-10-CM | POA: Diagnosis not present

## 2020-04-02 DIAGNOSIS — I10 Essential (primary) hypertension: Secondary | ICD-10-CM

## 2020-04-02 DIAGNOSIS — F32A Depression, unspecified: Secondary | ICD-10-CM

## 2020-04-02 NOTE — Telephone Encounter (Signed)
Requested medication (s) are due for refill today: yes  Requested medication (s) are on the active medication list:yes  Last refill: HCTZ #90  1 refill 08/22/19, Escitalopram #90 1 refill 08/22/19, Lisinopril  390  1 refill 08/22/19  Future visit scheduled: No called to schedule. No answer. Mail box is full  Notes to clinic:  Labs are overdue. Last 02/01/19    Requested Prescriptions  Pending Prescriptions Disp Refills   hydrochlorothiazide (HYDRODIURIL) 25 MG tablet [Pharmacy Med Name: HYDROCHLOROTHIAZIDE 25 MG TAB] 90 tablet 1    Sig: TAKE 1 TABLET BY MOUTH EVERY DAY      Cardiovascular: Diuretics - Thiazide Failed - 04/02/2020 12:22 PM      Failed - Ca in normal range and within 360 days    Calcium  Date Value Ref Range Status  02/01/2019 9.7 8.7 - 10.2 mg/dL Final          Failed - Cr in normal range and within 360 days    Creatinine, Ser  Date Value Ref Range Status  02/01/2019 0.96 0.76 - 1.27 mg/dL Final          Failed - K in normal range and within 360 days    Potassium  Date Value Ref Range Status  02/01/2019 4.5 3.5 - 5.2 mmol/L Final          Failed - Na in normal range and within 360 days    Sodium  Date Value Ref Range Status  02/01/2019 138 134 - 144 mmol/L Final          Failed - Valid encounter within last 6 months    Recent Outpatient Visits           7 months ago Anxiety and depression   [facility] Juline Patch, MD   1 year ago Essential hypertension   [place], Deanna C, MD   1 year ago Anxiety and depression   [facility] Juline Patch, MD   2 years ago Essential hypertension   [facility] Juline Patch, MD   3 years ago Essential hypertension   [place], Deanna C, MD                Passed - Last BP in normal range    BP Readings from Last 1 Encounters:  08/22/19 130/60            escitalopram (LEXAPRO) 20 MG tablet [Pharmacy Med Name: ESCITALOPRAM  OXALATE 20 MG TAB] 90 tablet 1    Sig: TAKE 1 TABLET BY MOUTH EVERY DAY      Psychiatry:  Antidepressants - SSRI Failed - 04/02/2020 12:22 PM      Failed - Valid encounter within last 6 months    Recent Outpatient Visits           7 months ago Anxiety and depression   [facility] Juline Patch, MD   1 year ago Essential hypertension   [place], Deanna C, MD   1 year ago Anxiety and depression   [facility] Juline Patch, MD   2 years ago Essential hypertension   [place], MD   3 years ago Essential hypertension   [place], MD                Passed - Completed PHQ-2 or PHQ-9 in the last 360 days  lisinopril (ZESTRIL) 30 MG tablet [Pharmacy Med Name: LISINOPRIL 30 MG TAB] 90 tablet 1    Sig: TAKE 1 TABLET BY MOUTH EVERY DAY      Cardiovascular:  ACE Inhibitors Failed - 04/02/2020 12:22 PM      Failed - Cr in normal range and within 180 days    Creatinine, Ser  Date Value Ref Range Status  02/01/2019 0.96 0.76 - 1.27 mg/dL Final          Failed - K in normal range and within 180 days    Potassium  Date Value Ref Range Status  02/01/2019 4.5 3.5 - 5.2 mmol/L Final          Failed - Valid encounter within last 6 months    Recent Outpatient Visits           7 months ago Anxiety and depression   [facility] Juline Patch, MD   1 year ago Essential hypertension   [facility] Juline Patch, MD   1 year ago Anxiety and depression   [facility] Juline Patch, MD   2 years ago Essential hypertension   [facility] Juline Patch, MD   3 years ago Essential hypertension   [facility] Juline Patch, MD                Passed - Patient is not pregnant      Passed - Last BP in normal range    BP Readings from Last 1 Encounters:  08/22/19 130/60

## 2020-04-09 DIAGNOSIS — G4733 Obstructive sleep apnea (adult) (pediatric): Secondary | ICD-10-CM | POA: Diagnosis not present

## 2020-05-09 ENCOUNTER — Other Ambulatory Visit: Payer: Self-pay | Admitting: Family Medicine

## 2020-05-09 DIAGNOSIS — F419 Anxiety disorder, unspecified: Secondary | ICD-10-CM

## 2020-05-09 DIAGNOSIS — E785 Hyperlipidemia, unspecified: Secondary | ICD-10-CM

## 2020-05-09 DIAGNOSIS — I10 Essential (primary) hypertension: Secondary | ICD-10-CM

## 2020-05-09 DIAGNOSIS — F32A Depression, unspecified: Secondary | ICD-10-CM

## 2020-05-10 ENCOUNTER — Other Ambulatory Visit: Payer: Self-pay

## 2020-05-10 ENCOUNTER — Telehealth: Payer: Self-pay | Admitting: Family Medicine

## 2020-05-10 DIAGNOSIS — I10 Essential (primary) hypertension: Secondary | ICD-10-CM

## 2020-05-10 DIAGNOSIS — F419 Anxiety disorder, unspecified: Secondary | ICD-10-CM

## 2020-05-10 DIAGNOSIS — F32A Depression, unspecified: Secondary | ICD-10-CM

## 2020-05-10 MED ORDER — LISINOPRIL 30 MG PO TABS: 30.0000 mg | ORAL_TABLET | Freq: Every day | ORAL | 0 refills | Status: DC

## 2020-05-10 MED ORDER — HYDROCHLOROTHIAZIDE 25 MG PO TABS: 25.0000 mg | ORAL_TABLET | Freq: Every day | ORAL | 0 refills | Status: DC

## 2020-05-10 MED ORDER — ESCITALOPRAM OXALATE 20 MG PO TABS: 20.0000 mg | ORAL_TABLET | Freq: Every day | ORAL | 0 refills | Status: DC

## 2020-05-10 NOTE — Telephone Encounter (Signed)
Pt called and I returned his call explaining that he had not been in since May of 2021. I have not received a request since 04/02/20, to refill his meds. They were sent in at that time for a 30 day period. Pt said that last time he was her was around October. I can see where a phone message was placed about sleep apnea, but not a visit since May of 2021. He also stated that he was told he could have his meds for a year. I explained to him, after looking back at his history, that we have been seeing pts every 6 months for maintenance meds. And it appears he has been coming every 6 months as well. He said he cannot afford to miss work like this. I sent in a 30 day supply to [facility], but told him that I needed for him to call back and schedule an appt when he returns back to town "next week." Pt voiced understanding, though seemed aggravated.

## 2020-05-10 NOTE — Telephone Encounter (Signed)
Patient would like the nurse to call him regarding his refill requests.  He does not understand why they were denied.  Explained to patient that he needs an appt. Scheduled.  Patient stated that the doctor sent in the script for a year and he does not need an appt.  Please call patient to discuss at [phone]

## 2020-05-21 ENCOUNTER — Encounter: Payer: Self-pay | Admitting: Family Medicine

## 2020-05-21 ENCOUNTER — Ambulatory Visit
Admission: RE | Admit: 2020-05-21 | Discharge: 2020-05-21 | Disposition: A | Discharge status: Home / Self Care | Payer: BC Managed Care – PPO | Source: Ambulatory Visit | Attending: Family Medicine | Admitting: Family Medicine

## 2020-05-21 ENCOUNTER — Other Ambulatory Visit: Payer: Self-pay

## 2020-05-21 ENCOUNTER — Ambulatory Visit: Payer: BC Managed Care – PPO | Admitting: Family Medicine

## 2020-05-21 ENCOUNTER — Ambulatory Visit
Admission: RE | Admit: 2020-05-21 | Discharge: 2020-05-21 | Disposition: A | Discharge status: Home / Self Care | Payer: BC Managed Care – PPO | Attending: Family Medicine | Admitting: Family Medicine

## 2020-05-21 VITALS — BP 136/82 | HR 80 | Ht 73.0 in | Wt 290.0 lb

## 2020-05-21 DIAGNOSIS — I10 Essential (primary) hypertension: Secondary | ICD-10-CM

## 2020-05-21 DIAGNOSIS — U099 Post covid-19 condition, unspecified: Secondary | ICD-10-CM

## 2020-05-21 DIAGNOSIS — R0609 Other forms of dyspnea: Secondary | ICD-10-CM

## 2020-05-21 DIAGNOSIS — E785 Hyperlipidemia, unspecified: Secondary | ICD-10-CM

## 2020-05-21 DIAGNOSIS — F32A Depression, unspecified: Secondary | ICD-10-CM

## 2020-05-21 DIAGNOSIS — F419 Anxiety disorder, unspecified: Secondary | ICD-10-CM

## 2020-05-21 MED ORDER — HYDROCHLOROTHIAZIDE 25 MG PO TABS: 25.0000 mg | ORAL_TABLET | Freq: Every day | ORAL | 1 refills | Status: DC

## 2020-05-21 MED ORDER — LISINOPRIL 30 MG PO TABS: 30.0000 mg | ORAL_TABLET | Freq: Every day | ORAL | 1 refills | Status: DC

## 2020-05-21 MED ORDER — ESCITALOPRAM OXALATE 20 MG PO TABS
20.0000 mg | ORAL_TABLET | Freq: Every day | ORAL | 1 refills | Status: DC
Start: 2020-05-21 — End: 2020-11-21

## 2020-05-21 MED ORDER — GEMFIBROZIL 600 MG PO TABS: 600.0000 mg | ORAL_TABLET | Freq: Two times a day (BID) | ORAL | 1 refills | Status: DC

## 2020-05-21 NOTE — Progress Notes (Signed)
Date:  05/21/2020   Name:  James Bishop   DOB:  November 04, 1960   MRN:  [id number]   Chief Complaint: Hyperlipidemia, Hypertension, Depression, and Shortness of Breath (Positive for covid Jan 21- wants lung eval)  Hyperlipidemia This is a chronic problem. The current episode started more than 1 year ago. The problem is controlled. Recent lipid tests were reviewed and are normal. He has no history of chronic renal disease, diabetes, hypothyroidism, liver disease, obesity or nephrotic syndrome. Associated symptoms include shortness of breath. Pertinent negatives include no chest pain, focal sensory loss, focal weakness, leg pain or myalgias. Current antihyperlipidemic treatment includes fibric acid derivatives (omega 3). The current treatment provides moderate improvement of lipids. There are no compliance problems.  Risk factors for coronary artery disease include dyslipidemia and hypertension.  Hypertension This is a chronic problem. The current episode started more than 1 year ago. The problem has been gradually improving since onset. The problem is controlled. Associated symptoms include shortness of breath. Pertinent negatives include no anxiety, blurred vision, chest pain, headaches, malaise/fatigue, neck pain, orthopnea, palpitations, peripheral edema, PND or sweats. There are no associated agents to hypertension. Risk factors for coronary artery disease include dyslipidemia. Past treatments include ACE inhibitors. The current treatment provides moderate improvement. There are no compliance problems.  There is no history of angina, kidney disease, CAD/MI, CVA, heart failure, left ventricular hypertrophy, PVD or retinopathy. There is no history of chronic renal disease, a hypertension causing med or renovascular disease.  Depression        This is a chronic problem.  The current episode started more than 1 year ago.   The onset quality is gradual.   The problem occurs daily.  The problem has been  gradually improving since onset.  Associated symptoms include no decreased concentration, no fatigue, no helplessness, no hopelessness, does not have insomnia, not irritable, no restlessness, no decreased interest, no appetite change, no body aches, no myalgias, no headaches, no indigestion, not sad and no suicidal ideas.  Past treatments include SSRIs - Selective serotonin reuptake inhibitors.  Compliance with treatment is good.  Previous treatment provided moderate relief.   Pertinent negatives include no hypothyroidism and no anxiety. Shortness of Breath This is a new problem. The current episode started 1 to 4 weeks ago. The problem occurs daily. The problem has been waxing and waning. Associated symptoms include sputum production. Pertinent negatives include no abdominal pain, chest pain, ear pain, fever, headaches, leg pain, leg swelling, neck pain, orthopnea, PND, rash, sore throat or wheezing. The symptoms are aggravated by any activity. The treatment provided moderate relief. There is no history of allergies, aspirin allergies, asthma, bronchiolitis, CAD, chronic lung disease, COPD, DVT, a heart failure, PE, pneumonia or a recent surgery.    Lab Results  Component Value Date   CREATININE 0.96 02/01/2019   BUN 19 02/01/2019   NA 138 02/01/2019   K 4.5 02/01/2019   CL 102 02/01/2019   CO2 22 02/01/2019   Lab Results  Component Value Date   CHOL 180 02/01/2019   HDL 38 (L) 02/01/2019   LDLCALC 123 (H) 02/01/2019   TRIG 103 02/01/2019   CHOLHDL 4.8 08/18/2016   No results found for: TSH No results found for: HGBA1C No results found for: WBC, HGB, HCT, MCV, PLT Lab Results  Component Value Date   ALT 25 02/01/2019   AST 20 02/01/2019   ALKPHOS 102 02/01/2019   BILITOT 0.7 02/01/2019     Review  of Systems  Constitutional: Negative for appetite change, chills, fatigue, fever and malaise/fatigue.  HENT: Negative for drooling, ear discharge, ear pain and sore throat.   Eyes:  Negative for blurred vision.  Respiratory: Positive for sputum production and shortness of breath. Negative for cough and wheezing.   Cardiovascular: Negative for chest pain, palpitations, orthopnea, leg swelling and PND.  Gastrointestinal: Negative for abdominal pain, blood in stool, constipation, diarrhea and nausea.  Endocrine: Negative for polydipsia.  Genitourinary: Negative for dysuria, frequency, hematuria and urgency.  Musculoskeletal: Negative for back pain, myalgias and neck pain.  Skin: Negative for rash.  Allergic/Immunologic: Negative for environmental allergies.  Neurological: Negative for dizziness, focal weakness and headaches.  Hematological: Does not bruise/bleed easily.  Psychiatric/Behavioral: Positive for depression. Negative for decreased concentration and suicidal ideas. The patient is not nervous/anxious and does not have insomnia.     Patient Active Problem List   Diagnosis Date Noted  . Polyp of sigmoid colon   . Class 2 severe obesity due to excess calories with serious comorbidity and body mass index (BMI) of 38.0 to 38.9 in adult (HCC) 09/21/2017  . Recurrent major depressive disorder, in partial remission (HCC) 08/18/2016  . Encounter for screening colonoscopy 09/27/2014  . Anxiety and depression 09/27/2014  . Dyslipidemia 09/27/2014  . Hypertension 09/27/2014    Allergies  Allergen Reactions  . Bee Venom   . Penicillins Rash and Other (See Comments)    fever    Past Surgical History:  Procedure Laterality Date  . COLONOSCOPY WITH PROPOFOL N/A 10/01/2017   Procedure: COLONOSCOPY WITH PROPOFOL;  Surgeon: Midge Minium, MD;  Location: [facility];  Service: Endoscopy;  Laterality: N/A;  . POLYPECTOMY  10/01/2017   Procedure: POLYPECTOMY;  Surgeon: Midge Minium, MD;  Location: [facility];  Service: Endoscopy;;    Social History   Tobacco Use  . Smoking status: Never Smoker  . Smokeless tobacco: Never Used  Substance Use Topics  .  Alcohol use: Not Currently    Alcohol/week: 0.0 standard drinks  . Drug use: No     Medication list has been reviewed and updated.  Current Meds  Medication Sig  . escitalopram (LEXAPRO) 20 MG tablet Take 1 tablet (20 mg total) by mouth daily.  Marland Kitchen gemfibrozil (LOPID) 600 MG tablet Take 1 tablet (600 mg total) by mouth 2 (two) times daily.  . hydrochlorothiazide (HYDRODIURIL) 25 MG tablet Take 1 tablet (25 mg total) by mouth daily.  Marland Kitchen lisinopril (ZESTRIL) 30 MG tablet Take 1 tablet (30 mg total) by mouth daily.  . Omega-3 Fatty Acids (FISH OIL) 500 MG CAPS Take 1 capsule (500 mg total) by mouth daily.    PHQ 2/9 Scores 05/21/2020 08/22/2019 02/01/2019 06/21/2018  PHQ - 2 Score 0 2 0 1  PHQ- 9 Score 0 2 0 1    GAD 7 : Generalized Anxiety Score 05/21/2020 08/22/2019 02/01/2019  Nervous, Anxious, on Edge 0 0 1  Control/stop worrying 0 1 0  Worry too much - different things 0 1 0  Trouble relaxing 0 0 0  Restless 0 0 0  Easily annoyed or irritable 0 1 0  Afraid - awful might happen 0 0 0  Total GAD 7 Score 0 3 1  Anxiety Difficulty - Not difficult at all Not difficult at all    BP Readings from Last 3 Encounters:  05/21/20 (!) 152/84  08/22/19 130/60  02/01/19 120/80    Physical Exam Vitals and nursing note reviewed.  Constitutional:  General: He is not irritable. HENT:     Head: Normocephalic.     Right Ear: External ear normal.     Left Ear: External ear normal.     Nose: Nose normal.     Mouth/Throat:     Mouth: Oropharynx is clear and moist.  Eyes:     General: No scleral icterus.       Right eye: No discharge.        Left eye: No discharge.     Extraocular Movements: EOM normal.     Conjunctiva/sclera: Conjunctivae normal.     Pupils: Pupils are equal, round, and reactive to light.  Neck:     Thyroid: No thyromegaly.     Vascular: No JVD.     Trachea: No tracheal deviation.  Cardiovascular:     Rate and Rhythm: Normal rate and regular rhythm.     Pulses:  Intact distal pulses.     Heart sounds: Normal heart sounds. No murmur heard. No friction rub. No gallop.   Pulmonary:     Effort: No respiratory distress.     Breath sounds: Normal breath sounds. No wheezing or rales.  Abdominal:     General: Bowel sounds are normal.     Palpations: Abdomen is soft. There is no hepatosplenomegaly or mass.     Tenderness: There is no abdominal tenderness. There is no CVA tenderness, guarding or rebound.  Musculoskeletal:        General: No tenderness or edema. Normal range of motion.     Cervical back: Normal range of motion and neck supple.  Lymphadenopathy:     Cervical: No cervical adenopathy.  Skin:    General: Skin is warm.     Findings: No rash.  Neurological:     Mental Status: He is alert and oriented to person, place, and time.     Cranial Nerves: No cranial nerve deficit.     Deep Tendon Reflexes: Strength normal and reflexes are normal and symmetric.     Wt Readings from Last 3 Encounters:  05/21/20 290 lb (131.5 kg)  08/22/19 282 lb (127.9 kg)  02/01/19 274 lb (124.3 kg)    BP (!) 152/84   Pulse 80   Ht 6\' 1"  (1.854 m)   Wt 290 lb (131.5 kg)   SpO2 96%   BMI 38.26 kg/m   Assessment and Plan:  1. Essential hypertension Chronic.  Controlled.  Stable.  Blood pressure is 136/82. Patient will continue hydrochlorothiazide 25 mg and lisinopril 30 mg once a day. - hydrochlorothiazide (HYDRODIURIL) 25 MG tablet; Take 1 tablet (25 mg total) by mouth daily.  Dispense: 90 tablet; Refill: 1 - lisinopril (ZESTRIL) 30 MG tablet; Take 1 tablet (30 mg total) by mouth daily.  Dispense: 90 tablet; Refill: 1  2. Anxiety and depression Chronic. Controlled. Stable. PHQ is 0 Gad score is 0 patient will continue S-Citalopram 20 mg once a day. - escitalopram (LEXAPRO) 20 MG tablet; Take 1 tablet (20 mg total) by mouth daily.  Dispense: 90 tablet; Refill: 1  3. Dyslipidemia . Controlled. Stable. Continue gemfibrozil 600 mg twice a day. -  gemfibrozil (LOPID) 600 MG tablet; Take 1 tablet (600 mg total) by mouth 2 (two) times daily.  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 Patient is day 10 from testing positive for Covid but is continuing to have dyspnea primarily on exertion. Lungs are clear there is no audible rales or rhonchi or wheezes. We would  get a chest x-ray to see if there is any pulmonary involvement due to the Covid. Pulse ox is 97% at rest and when exercise it decreased to 96%. We will get an x-ray of the lungs to look at the status and pending this whether or not we need to proceed with pulmonary referral. - Ambulatory referral to Pulmonology - DG Chest 2 View; Future

## 2020-05-22 LAB — RENAL FUNCTION PANEL
Albumin: 4.4 g/dL (ref 3.8–4.9)
BUN/Creatinine Ratio: 16 (ref 9–20)
BUN: 15 mg/dL (ref 6–24)
CO2: 23 mmol/L (ref 20–29)
Calcium: 9.5 mg/dL (ref 8.7–10.2)
Chloride: 99 mmol/L (ref 96–106)
Creatinine, Ser: 0.91 mg/dL (ref 0.76–1.27)
GFR calc Af Amer: 107 mL/min/{1.73_m2} (ref >59)
GFR calc non Af Amer: 93 mL/min/{1.73_m2} (ref >59)
Glucose: 123 mg/dL — ABNORMAL HIGH (ref 65–99)
Phosphorus: 3.2 mg/dL (ref 2.8–4.1)
Potassium: 4.2 mmol/L (ref 3.5–5.2)
Sodium: 139 mmol/L (ref 134–144)

## 2020-05-22 LAB — LIPID PANEL WITH LDL/HDL RATIO
Cholesterol, Total: 184 mg/dL (ref 100–199)
HDL: 33 mg/dL — ABNORMAL LOW (ref >39)
LDL Chol Calc (NIH): 98 mg/dL (ref 0–99)
LDL/HDL Ratio: 3 ratio (ref 0.0–3.6)
Triglycerides: 312 mg/dL — ABNORMAL HIGH (ref 0–149)
VLDL Cholesterol Cal: 53 mg/dL — ABNORMAL HIGH (ref 5–40)

## 2020-06-15 ENCOUNTER — Ambulatory Visit (INDEPENDENT_AMBULATORY_CARE_PROVIDER_SITE_OTHER): Payer: Self-pay | Admitting: Family Medicine

## 2020-06-15 ENCOUNTER — Encounter: Payer: Self-pay | Admitting: Family Medicine

## 2020-06-15 ENCOUNTER — Other Ambulatory Visit: Payer: Self-pay

## 2020-06-15 VITALS — BP 120/72 | HR 64 | Ht 73.0 in | Wt 284.0 lb

## 2020-06-15 DIAGNOSIS — U099 Post covid-19 condition, unspecified: Secondary | ICD-10-CM

## 2020-06-15 DIAGNOSIS — R0609 Other forms of dyspnea: Secondary | ICD-10-CM

## 2020-06-15 DIAGNOSIS — M654 Radial styloid tenosynovitis [de Quervain]: Secondary | ICD-10-CM

## 2020-06-15 DIAGNOSIS — M19042 Primary osteoarthritis, left hand: Secondary | ICD-10-CM

## 2020-06-15 MED ORDER — IBUPROFEN 800 MG PO TABS
800.0000 mg | ORAL_TABLET | Freq: Three times a day (TID) | ORAL | 6 refills | Status: DC | PRN
Start: 2020-06-15 — End: 2021-09-26

## 2020-06-15 NOTE — Progress Notes (Signed)
arthritis   Date:  06/15/2020   Name:  James Bishop   DOB:  12-28-1960   MRN:  [id number]   Chief Complaint: Follow-up (Shortness of breath- better/ gone) and joint pains (Would like ibuprofen 800mg  sent in)  Shortness of Breath This is a recurrent problem. The problem has been gradually improving. Pertinent negatives include no leg swelling. The symptoms are aggravated by pollens.  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 The pain is present in the left hand (first [facility]). The quality of the pain is described as aching ("twingy"). The pain is at a severity of 6/10. The pain is moderate. The pain has been intermittent since the incident. The symptoms are aggravated by movement. He has tried NSAIDs (ibuprofen) for the symptoms. The treatment provided mild relief.    Lab Results  Component Value Date   CREATININE 0.91 05/21/2020   BUN 15 05/21/2020   NA 139 05/21/2020   K 4.2 05/21/2020   CL 99 05/21/2020   CO2 23 05/21/2020   Lab Results  Component Value Date   CHOL 184 05/21/2020   HDL 33 (L) 05/21/2020   LDLCALC 98 05/21/2020   TRIG 312 (H) 05/21/2020   CHOLHDL 4.8 08/18/2016   No results found for: TSH No results found for: HGBA1C No results found for: WBC, HGB, HCT, MCV, PLT Lab Results  Component Value Date   ALT 25 02/01/2019   AST 20 02/01/2019   ALKPHOS 102 02/01/2019   BILITOT 0.7 02/01/2019     Review of Systems  Respiratory: Positive for shortness of breath.   Cardiovascular: Negative for leg swelling.    Patient Active Problem List   Diagnosis Date Noted  . Polyp of sigmoid colon   . Class 2 severe obesity due to excess calories with serious comorbidity and body mass index (BMI) of 38.0 to 38.9 in adult ([place]) 09/21/2017  . Recurrent major depressive disorder, in partial remission ([place]) 08/18/2016  . Encounter for screening colonoscopy 09/27/2014  . Anxiety and depression 09/27/2014  . Dyslipidemia 09/27/2014  . Hypertension  09/27/2014    Allergies  Allergen Reactions  . Bee Venom   . Penicillins Rash and Other (See Comments)    fever    Past Surgical History:  Procedure Laterality Date  . COLONOSCOPY WITH PROPOFOL N/A 10/01/2017   Procedure: COLONOSCOPY WITH PROPOFOL;  Surgeon: Lucilla Lame, MD;  Location: [place];  Service: Endoscopy;  Laterality: N/A;  . POLYPECTOMY  10/01/2017   Procedure: POLYPECTOMY;  Surgeon: Lucilla Lame, MD;  Location: [facility];  Service: Endoscopy;;    Social History   Tobacco Use  . Smoking status: Never Smoker  . Smokeless tobacco: Never Used  Substance Use Topics  . Alcohol use: Not Currently    Alcohol/week: 0.0 standard drinks  . Drug use: No     Medication list has been reviewed and updated.  Current Meds  Medication Sig  . escitalopram (LEXAPRO) 20 MG tablet Take 1 tablet (20 mg total) by mouth daily.  Marland Kitchen gemfibrozil (LOPID) 600 MG tablet Take 1 tablet (600 mg total) by mouth 2 (two) times daily.  . hydrochlorothiazide (HYDRODIURIL) 25 MG tablet Take 1 tablet (25 mg total) by mouth daily.  Marland Kitchen lisinopril (ZESTRIL) 30 MG tablet Take 1 tablet (30 mg total) by mouth daily.  . Omega-3 Fatty Acids (FISH OIL) 500 MG CAPS Take 1 capsule (500 mg total) by mouth daily.    PHQ 2/9 Scores 06/15/2020 05/21/2020 08/22/2019  02/01/2019  PHQ - 2 Score 0 0 2 0  PHQ- 9 Score 0 0 2 0    GAD 7 : Generalized Anxiety Score 06/15/2020 05/21/2020 08/22/2019 02/01/2019  Nervous, Anxious, on Edge 0 0 0 1  Control/stop worrying 0 0 1 0  Worry too much - different things 0 0 1 0  Trouble relaxing 0 0 0 0  Restless 0 0 0 0  Easily annoyed or irritable 0 0 1 0  Afraid - awful might happen 0 0 0 0  Total GAD 7 Score 0 0 3 1  Anxiety Difficulty - - Not difficult at all Not difficult at all    BP Readings from Last 3 Encounters:  06/15/20 120/72  05/21/20 136/82  08/22/19 130/60    Physical Exam Vitals and nursing note reviewed.  HENT:     Head: Normocephalic.      Right Ear: External ear normal.     Left Ear: External ear normal.     Nose: Nose normal.     Mouth/Throat:     Mouth: Oropharynx is clear and moist.  Eyes:     General: No scleral icterus.       Right eye: No discharge.        Left eye: No discharge.     Extraocular Movements: EOM normal.     Conjunctiva/sclera: Conjunctivae normal.     Pupils: Pupils are equal, round, and reactive to light.  Neck:     Thyroid: No thyromegaly.     Vascular: No JVD.     Trachea: No tracheal deviation.  Cardiovascular:     Rate and Rhythm: Normal rate and regular rhythm.     Pulses: Intact distal pulses.     Heart sounds: Normal heart sounds. No murmur heard. No friction rub. No gallop.   Pulmonary:     Effort: No respiratory distress.     Breath sounds: Normal breath sounds. No decreased breath sounds, wheezing, rhonchi or rales.  Abdominal:     General: Bowel sounds are normal.     Palpations: Abdomen is soft. There is no hepatosplenomegaly or mass.     Tenderness: There is no abdominal tenderness. There is no CVA tenderness, guarding or rebound.  Musculoskeletal:        General: No edema. Normal range of motion.     Right hand: Tenderness present.     Left hand: Tenderness present.     Cervical back: Normal range of motion and neck supple.     Comments: Tender 1st MC joint/extensor tendon L>R/Finkelstein negative  Lymphadenopathy:     Cervical: No cervical adenopathy.  Skin:    General: Skin is warm.     Findings: No rash.  Neurological:     Mental Status: He is alert and oriented to person, place, and time.     Cranial Nerves: No cranial nerve deficit.     Deep Tendon Reflexes: Strength normal and reflexes are normal and symmetric.     Wt Readings from Last 3 Encounters:  06/15/20 284 lb (128.8 kg)  05/21/20 290 lb (131.5 kg)  08/22/19 282 lb (127.9 kg)    BP 120/72   Pulse 64   Ht 6\' 1"  (1.854 m)   Wt 284 lb (128.8 kg)   BMI 37.47 kg/m   Assessment and  Plan:

## 2020-06-15 NOTE — Patient Instructions (Signed)
De Quervain's Tenosynovitis  De Quervain's tenosynovitis is a condition that causes inflammation of the tendon on the thumb side of the wrist. Tendons are cords of tissue that connect bones to muscles. The tendons in the hand pass through a tunnel called a sheath. A slippery layer of tissue (synovium) lets the tendons move smoothly in the sheath. With de Quervain's tenosynovitis, the sheath swells or thickens, causing friction and pain. The condition is also called de Quervain's disease and de Quervain's syndrome. It occurs most often in women who are 10-68 years old. What are the causes? The exact cause of this condition is not known. It may be associated with overuse of the hand and wrist. What increases the risk? You are more likely to develop this condition if you:  Use your hands far more than normal, especially if you repeat certain movements that involve twisting your hand or using a tight grip.  Are pregnant.  Are a middle-aged woman.  Have rheumatoid arthritis.  Have diabetes. What are the signs or symptoms? The main symptom of this condition is pain on the thumb side of the wrist. The pain may get worse when you grasp something or turn your wrist. Other symptoms may include:  Pain that extends up the forearm.  Swelling of your wrist and hand.  Trouble moving the thumb and wrist.  A sensation of snapping in the wrist.  A bump filled with fluid (cyst) in the area of the pain. How is this diagnosed? This condition may be diagnosed based on:  Your symptoms and medical history.  A physical exam. During the exam, your health care provider may do a simple test Wynn Maudlin test) that involves pulling your thumb and wrist to see if this causes pain. You may also need to have an X-ray or ultrasound. How is this treated? Treatment for this condition may include:  Avoiding any activity that causes pain and swelling.  Taking medicines. Anti-inflammatory medicines and  corticosteroid injections may be used to reduce inflammation and relieve pain.  Wearing a splint.  Having surgery. This may be needed if other treatments do not work. Once the pain and swelling have gone down, you may start:  Physical therapy. This includes exercises to improve movement and strength in your wrist and thumb.  Occupational therapy. This includes adjusting how you move your wrist. Follow these instructions at home: If you have a splint:  Wear the splint as told by your health care provider. Remove it only as told by your health care provider.  Loosen the splint if your fingers tingle, become numb, or turn cold and blue.  Keep the splint clean.  If the splint is not waterproof: ? Do not let it get wet. ? Cover it with a watertight covering when you take a bath or a shower. Managing pain, stiffness, and swelling  Avoid movements and activities that cause pain and swelling in the wrist area.  If directed, put ice on the painful area. This may be helpful after doing activities that involve the sore wrist. To do this: ? Put ice in a plastic bag. ? Place a towel between your skin and the bag. ? Leave the ice on for 20 minutes, 2-3 times a day. ? Remove the ice if your skin turns bright red. This is very important. If you cannot feel pain, heat, or cold, you have a greater risk of damage to the area.  Move your fingers often to reduce stiffness and swelling.  Raise (elevate) the  injured area above the level of your heart while you are sitting or lying down.   General instructions  Return to your normal activities as told by your health care provider. Ask your health care provider what activities are safe for you.  Take over-the-counter and prescription medicines only as told by your health care provider.  Keep all follow-up visits. This is important. Contact a health care provider if:  Your pain medicine does not help.  Your pain gets worse.  You develop new  symptoms. Summary  De Quervain's tenosynovitis is a condition that causes inflammation of the tendon on the thumb side of the wrist.  The condition occurs most often in women who are 42-51 years old.  The exact cause of this condition is not known. It may be associated with overuse of the hand and wrist.  Treatment starts with avoiding activity that causes pain or swelling in the wrist area. Other treatments may include wearing a splint and taking medicine. Sometimes, surgery is needed. This information is not intended to replace advice given to you by your health care provider. Make sure you discuss any questions you have with your health care provider. Document Revised: 07/20/2019 Document Reviewed: 07/20/2019 Elsevier Patient Education  2021 [employer].

## 2020-11-21 ENCOUNTER — Encounter: Payer: Self-pay | Admitting: Family Medicine

## 2020-11-21 ENCOUNTER — Ambulatory Visit (INDEPENDENT_AMBULATORY_CARE_PROVIDER_SITE_OTHER): Payer: 59 | Admitting: Family Medicine

## 2020-11-21 ENCOUNTER — Other Ambulatory Visit: Payer: Self-pay

## 2020-11-21 VITALS — BP 112/70 | HR 80 | Ht 73.0 in | Wt 282.0 lb

## 2020-11-21 DIAGNOSIS — E785 Hyperlipidemia, unspecified: Secondary | ICD-10-CM | POA: Diagnosis not present

## 2020-11-21 DIAGNOSIS — F32A Depression, unspecified: Secondary | ICD-10-CM

## 2020-11-21 DIAGNOSIS — I10 Essential (primary) hypertension: Secondary | ICD-10-CM | POA: Diagnosis not present

## 2020-11-21 DIAGNOSIS — F419 Anxiety disorder, unspecified: Secondary | ICD-10-CM | POA: Diagnosis not present

## 2020-11-21 MED ORDER — ESCITALOPRAM OXALATE 20 MG PO TABS: 20.0000 mg | ORAL_TABLET | Freq: Every day | ORAL | 1 refills | Status: DC

## 2020-11-21 MED ORDER — LISINOPRIL 30 MG PO TABS: 30.0000 mg | ORAL_TABLET | Freq: Every day | ORAL | 1 refills | Status: DC

## 2020-11-21 MED ORDER — GEMFIBROZIL 600 MG PO TABS: 600.0000 mg | ORAL_TABLET | Freq: Two times a day (BID) | ORAL | 1 refills | Status: DC

## 2020-11-21 MED ORDER — HYDROCHLOROTHIAZIDE 25 MG PO TABS: 25.0000 mg | ORAL_TABLET | Freq: Every day | ORAL | 1 refills | Status: DC

## 2020-11-21 NOTE — Patient Instructions (Signed)

## 2020-11-21 NOTE — Progress Notes (Signed)
Date:  11/21/2020   Name:  James Bishop   DOB:  1960/06/10   MRN:  [id number]   Chief Complaint: Depression, Hypertension, and Hyperlipidemia  Depression        This is a chronic problem.  The current episode started more than 1 year ago.   The onset quality is gradual.   The problem occurs intermittently.  The problem has been gradually improving since onset.  Associated symptoms include no decreased concentration, no fatigue, no helplessness, no hopelessness, does not have insomnia, not irritable, no restlessness, no decreased interest, no appetite change, no body aches, no myalgias, no headaches, no indigestion, not sad and no suicidal ideas.  Past treatments include SSRIs - Selective serotonin reuptake inhibitors.  Compliance with treatment is good.  Previous treatment provided moderate relief.  Past medical history includes anxiety.     Pertinent negatives include no hypothyroidism. Hypertension This is a chronic problem. The current episode started more than 1 year ago. The problem has been gradually improving since onset. The problem is controlled. Associated symptoms include anxiety. Pertinent negatives include no chest pain, headaches, malaise/fatigue, palpitations, peripheral edema, PND or shortness of breath. Past treatments include ACE inhibitors and diuretics. The current treatment provides moderate improvement. There are no compliance problems.  There is no history of angina, kidney disease, CAD/MI, CVA, heart failure, left ventricular hypertrophy, PVD or retinopathy. There is no history of chronic renal disease, a hypertension causing med or renovascular disease.  Hyperlipidemia This is a chronic problem. The current episode started more than 1 year ago. The problem is controlled. Recent lipid tests were reviewed and are normal. He has no history of chronic renal disease, hypothyroidism or obesity. Pertinent negatives include no chest pain, focal sensory loss, focal weakness, leg  pain, myalgias or shortness of breath. Current antihyperlipidemic treatment includes fibric acid derivatives. The current treatment provides moderate improvement of lipids. There are no compliance problems.  Risk factors for coronary artery disease include hypertension and dyslipidemia.   Lab Results  Component Value Date   CREATININE 0.91 05/21/2020   BUN 15 05/21/2020   NA 139 05/21/2020   K 4.2 05/21/2020   CL 99 05/21/2020   CO2 23 05/21/2020   Lab Results  Component Value Date   CHOL 184 05/21/2020   HDL 33 (L) 05/21/2020   LDLCALC 98 05/21/2020   TRIG 312 (H) 05/21/2020   CHOLHDL 4.8 08/18/2016   No results found for: TSH No results found for: HGBA1C No results found for: WBC, HGB, HCT, MCV, PLT Lab Results  Component Value Date   ALT 25 02/01/2019   AST 20 02/01/2019   ALKPHOS 102 02/01/2019   BILITOT 0.7 02/01/2019     Review of Systems  Constitutional:  Negative for appetite change, fatigue and malaise/fatigue.  Respiratory:  Negative for shortness of breath.   Cardiovascular:  Negative for chest pain, palpitations and PND.  Musculoskeletal:  Negative for myalgias.  Neurological:  Negative for focal weakness and headaches.  Psychiatric/Behavioral:  Positive for depression. Negative for decreased concentration and suicidal ideas. The patient does not have insomnia.    Patient Active Problem List   Diagnosis Date Noted   Polyp of sigmoid colon    Class 2 severe obesity due to excess calories with serious comorbidity and body mass index (BMI) of 38.0 to 38.9 in adult [facility]) 09/21/2017   Recurrent major depressive disorder, in partial remission ([place]) 08/18/2016   Encounter for screening colonoscopy 09/27/2014   Anxiety and  depression 09/27/2014   Dyslipidemia 09/27/2014   Hypertension 09/27/2014    Allergies  Allergen Reactions   Bee Venom    Penicillins Rash and Other (See Comments)    fever    Past Surgical History:  Procedure Laterality Date    COLONOSCOPY WITH PROPOFOL N/A 10/01/2017   Procedure: COLONOSCOPY WITH PROPOFOL;  Surgeon: Lucilla Lame, MD;  Location: [place];  Service: Endoscopy;  Laterality: N/A;   POLYPECTOMY  10/01/2017   Procedure: POLYPECTOMY;  Surgeon: Lucilla Lame, MD;  Location: [facility];  Service: Endoscopy;;    Social History   Tobacco Use   Smoking status: Never   Smokeless tobacco: Never  Substance Use Topics   Alcohol use: Not Currently    Alcohol/week: 0.0 standard drinks   Drug use: No     Medication list has been reviewed and updated.  Current Meds  Medication Sig   escitalopram (LEXAPRO) 20 MG tablet Take 1 tablet (20 mg total) by mouth daily.   gemfibrozil (LOPID) 600 MG tablet Take 1 tablet (600 mg total) by mouth 2 (two) times daily.   hydrochlorothiazide (HYDRODIURIL) 25 MG tablet Take 1 tablet (25 mg total) by mouth daily.   ibuprofen (ADVIL) 800 MG tablet Take 1 tablet (800 mg total) by mouth every 8 (eight) hours as needed.   lisinopril (ZESTRIL) 30 MG tablet Take 1 tablet (30 mg total) by mouth daily.   Omega-3 Fatty Acids (FISH OIL) 500 MG CAPS Take 1 capsule (500 mg total) by mouth daily.    PHQ 2/9 Scores 11/21/2020 06/15/2020 05/21/2020 08/22/2019  PHQ - 2 Score 0 0 0 2  PHQ- 9 Score 0 0 0 2    GAD 7 : Generalized Anxiety Score 11/21/2020 06/15/2020 05/21/2020 08/22/2019  Nervous, Anxious, on Edge 0 0 0 0  Control/stop worrying 0 0 0 1  Worry too much - different things 0 0 0 1  Trouble relaxing 0 0 0 0  Restless 0 0 0 0  Easily annoyed or irritable 0 0 0 1  Afraid - awful might happen 0 0 0 0  Total GAD 7 Score 0 0 0 3  Anxiety Difficulty - - - Not difficult at all    BP Readings from Last 3 Encounters:  06/15/20 120/72  05/21/20 136/82  08/22/19 130/60    Physical Exam Constitutional:      General: He is not irritable.   Wt Readings from Last 3 Encounters:  11/21/20 282 lb (127.9 kg)  06/15/20 284 lb (128.8 kg)  05/21/20 290 lb (131.5 kg)     Ht '6\' 1"'$  (1.854 m)   Wt 282 lb (127.9 kg)   BMI 37.21 kg/m   Assessment and Plan:  1. Essential hypertension Chronic.  Controlled.  Stable.  Blood pressure is 112/70.  Continue hydrochlorothiazide 25 mg and lisinopril 30 mg once a day.  Will check CMP for electrolytes and GFR. - hydrochlorothiazide (HYDRODIURIL) 25 MG tablet; Take 1 tablet (25 mg total) by mouth daily.  Dispense: 90 tablet; Refill: 1 - lisinopril (ZESTRIL) 30 MG tablet; Take 1 tablet (30 mg total) by mouth daily.  Dispense: 90 tablet; Refill: 1 - Comprehensive Metabolic Panel (CMET)  2. Anxiety and depression Panic.  Controlled.  Stable.  PHQ is 0 and gad score is 0.  Continue escitalopram 20 mg once a day. - escitalopram (LEXAPRO) 20 MG tablet; Take 1 tablet (20 mg total) by mouth daily.  Dispense: 90 tablet; Refill: 1  3. Dyslipidemia Chronic.  Controlled.  Stable.  Continue gemfibrozil 600 mg 1 tablet twice a day.  Will check lipid panel for current status of LDL.  And omega-3 for triglycerides as well.  We will also provide low-cholesterol low triglyceride dietary guidelines as well. - gemfibrozil (LOPID) 600 MG tablet; Take 1 tablet (600 mg total) by mouth 2 (two) times daily.  Dispense: 180 tablet; Refill: 1 - Lipid Panel With LDL/HDL Ratio

## 2020-11-22 ENCOUNTER — Other Ambulatory Visit: Payer: Self-pay

## 2020-11-22 DIAGNOSIS — E785 Hyperlipidemia, unspecified: Secondary | ICD-10-CM

## 2020-11-22 LAB — COMPREHENSIVE METABOLIC PANEL
ALT: 15 IU/L (ref 0–44)
AST: 14 IU/L (ref 0–40)
Albumin/Globulin Ratio: 2 (ref 1.2–2.2)
Albumin: 4.9 g/dL (ref 3.8–4.9)
Alkaline Phosphatase: 91 IU/L (ref 44–121)
BUN/Creatinine Ratio: 17 (ref 9–20)
BUN: 17 mg/dL (ref 6–24)
Bilirubin Total: 0.7 mg/dL (ref 0.0–1.2)
CO2: 24 mmol/L (ref 20–29)
Calcium: 9.9 mg/dL (ref 8.7–10.2)
Chloride: 102 mmol/L (ref 96–106)
Creatinine, Ser: 1.02 mg/dL (ref 0.76–1.27)
Globulin, Total: 2.4 g/dL (ref 1.5–4.5)
Glucose: 124 mg/dL — ABNORMAL HIGH (ref 65–99)
Potassium: 5 mmol/L (ref 3.5–5.2)
Sodium: 141 mmol/L (ref 134–144)
Total Protein: 7.3 g/dL (ref 6.0–8.5)
eGFR: 85 mL/min/{1.73_m2} (ref >59)

## 2020-11-22 LAB — LIPID PANEL WITH LDL/HDL RATIO
Cholesterol, Total: 209 mg/dL — ABNORMAL HIGH (ref 100–199)
HDL: 47 mg/dL (ref >39)
LDL Chol Calc (NIH): 143 mg/dL — ABNORMAL HIGH (ref 0–99)
LDL/HDL Ratio: 3 ratio (ref 0.0–3.6)
Triglycerides: 107 mg/dL (ref 0–149)
VLDL Cholesterol Cal: 19 mg/dL (ref 5–40)

## 2020-11-22 MED ORDER — ATORVASTATIN CALCIUM 10 MG PO TABS: 10.0000 mg | ORAL_TABLET | Freq: Every day | ORAL | 1 refills | Status: DC

## 2020-11-22 NOTE — Progress Notes (Signed)
Sent in atorv/ stop gemfib

## 2021-01-29 ENCOUNTER — Encounter: Payer: Self-pay | Admitting: Family Medicine

## 2021-01-29 ENCOUNTER — Other Ambulatory Visit: Payer: Self-pay

## 2021-01-29 DIAGNOSIS — E785 Hyperlipidemia, unspecified: Secondary | ICD-10-CM

## 2021-01-29 MED ORDER — ATORVASTATIN CALCIUM 10 MG PO TABS: 10.0000 mg | ORAL_TABLET | Freq: Every day | ORAL | 0 refills | Status: DC

## 2021-02-21 ENCOUNTER — Other Ambulatory Visit: Payer: Self-pay

## 2021-02-21 ENCOUNTER — Ambulatory Visit
Admission: EM | Admit: 2021-02-21 | Discharge: 2021-02-21 | Disposition: A | Discharge status: Home / Self Care | Payer: 59 | Attending: Physician Assistant | Admitting: Physician Assistant

## 2021-02-21 DIAGNOSIS — M109 Gout, unspecified: Secondary | ICD-10-CM

## 2021-02-21 MED ORDER — PREDNISONE 10 MG PO TABS: ORAL_TABLET | ORAL | 0 refills | Status: DC

## 2021-02-21 NOTE — Discharge Instructions (Signed)
-  You are having a gout flareup of your ankle. - I have sent a prednisone taper.  You can also take prednisone.  Make sure to ice and elevate the ankle as well. - Follow-up as needed.

## 2021-02-21 NOTE — ED Provider Notes (Signed)
[facility]    CSN: [id number] Arrival date & time: 02/21/21  9323      History   Chief Complaint Chief Complaint  Patient presents with   Ankle Pain    L    HPI James Bishop is a 60 y.o. male presenting for left lateral ankle pain since last night.  Patient denies any sort of injury.  Does have a little bit of redness in the area.  He has not had any fevers to report.  There are no open wounds.  Patient reports that he takes ibuprofen 800 mg tablets as needed for flareups of arthritis.  He does report that he has had gout in the past.  He said normally it happens when he eats pork but he has not recently had any pork.  Patient also recently denies any alcohol use.  History of hypertension, hyperlipidemia and patient does take HCTZ.  He has taken the 800 mg ibuprofen and says that it did help.  A little bit of increased pain when he is bearing weight.  No other complaints.  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   Hyperlipidemia    Hypertension    controlled    Patient Active Problem List   Diagnosis Date Noted   Polyp of sigmoid colon    Class 2 severe obesity due to excess calories with serious comorbidity and body mass index (BMI) of 38.0 to 38.9 in adult [facility]) 09/21/2017   Recurrent major depressive disorder, in partial remission ([place]) 08/18/2016   Encounter for screening colonoscopy 09/27/2014   Anxiety and depression 09/27/2014   Dyslipidemia 09/27/2014   Hypertension 09/27/2014    Past Surgical History:  Procedure Laterality Date   COLONOSCOPY WITH PROPOFOL N/A 10/01/2017   Procedure: COLONOSCOPY WITH PROPOFOL;  Surgeon: Lucilla Lame, MD;  Location: [place];  Service: Endoscopy;  Laterality: N/A;   POLYPECTOMY  10/01/2017   Procedure: POLYPECTOMY;  Surgeon: Lucilla Lame, MD;  Location: [place];  Service: Endoscopy;;       Home Medications    Prior to  Admission medications   Medication Sig Start Date End Date Taking? Authorizing Provider  atorvastatin (LIPITOR) 10 MG tablet Take 1 tablet (10 mg total) by mouth daily. 01/29/21  Yes Juline Patch, MD  escitalopram (LEXAPRO) 20 MG tablet Take 1 tablet (20 mg total) by mouth daily. 11/21/20  Yes Juline Patch, MD  hydrochlorothiazide (HYDRODIURIL) 25 MG tablet Take 1 tablet (25 mg total) by mouth daily. 11/21/20  Yes Juline Patch, MD  ibuprofen (ADVIL) 800 MG tablet Take 1 tablet (800 mg total) by mouth every 8 (eight) hours as needed. 06/15/20  Yes Juline Patch, MD  lisinopril (ZESTRIL) 30 MG tablet Take 1 tablet (30 mg total) by mouth daily. 11/21/20  Yes Juline Patch, MD  Omega-3 Fatty Acids (FISH OIL) 500 MG CAPS Take 1 capsule (500 mg total) by mouth daily. 08/22/19  Yes Juline Patch, MD  predniSONE (DELTASONE) 10 MG tablet Take 6 tabs PO on day 1 and decrease by 1 tab daily until complete 02/21/21  Yes Danton Clap, PA-C    Family History Family History  Problem Relation Age of Onset   Heart disease Maternal Grandmother    Heart disease Maternal Grandfather     Social History Social History   Tobacco Use   Smoking status: Never  Smokeless tobacco: Never  Substance Use Topics   Alcohol use: Not Currently    Alcohol/week: 0.0 standard drinks   Drug use: No     Allergies   Bee venom and Penicillins   Review of Systems Review of Systems  Musculoskeletal:  Positive for arthralgias, gait problem and joint swelling.  Skin:  Positive for color change. Negative for wound.  Neurological:  Negative for weakness and numbness.    Physical Exam Triage Vital Signs ED Triage Vitals  Enc Vitals Group     BP      Pulse      Resp      Temp      Temp src      SpO2      Weight      Height      Head Circumference      Peak Flow      Pain Score      Pain Loc      Pain Edu?      Excl. in [place]?    No data found.  Updated Vital Signs BP 125/81 (BP Location: Right  Arm)   Pulse 73   Temp 98.9 F (37.2 C) (Oral)   Resp 18   SpO2 98%      Physical Exam Vitals and nursing note reviewed.  Constitutional:      General: He is not in acute distress.    Appearance: Normal appearance. He is well-developed. He is not ill-appearing.  HENT:     Head: Normocephalic and atraumatic.  Eyes:     General: No scleral icterus.    Conjunctiva/sclera: Conjunctivae normal.  Cardiovascular:     Rate and Rhythm: Normal rate.     Pulses: Normal pulses.  Pulmonary:     Effort: Pulmonary effort is normal. No respiratory distress.  Musculoskeletal:     Cervical back: Neck supple.     Left ankle: Swelling (moderate swelling left lateral ankle with erythema (faint)) present. Tenderness (TTP about the lateral malleolus and just distal to) present over the lateral malleolus. Decreased range of motion.  Skin:    General: Skin is warm and dry.  Neurological:     General: No focal deficit present.     Mental Status: He is alert. Mental status is at baseline.     Motor: No weakness.     Coordination: Coordination normal.     Gait: Gait abnormal.  Psychiatric:        Mood and Affect: Mood normal.        Behavior: Behavior normal.        Thought Content: Thought content normal.     UC Treatments / Results  Labs (all labs ordered are listed, but only abnormal results are displayed) Labs Reviewed - No data to display  EKG   Radiology No results found.  Procedures Procedures (including critical care time)  Medications Ordered in UC Medications - No data to display  Initial Impression / Assessment and Plan / UC Course  I have reviewed the triage vital signs and the nursing notes.  Pertinent labs & imaging results that were available during my care of the patient were reviewed by me and considered in my medical decision making (see chart for details).  60 year old male presenting for pain, swelling and erythema of the left lateral ankle since last night.   History of gout.  On exam he does have mild to moderate erythema edema and swelling of the lateral ankle about the lateral malleolus  with tenderness to palpation.  Increased pain with movement.  This is consistent with suspected gout flareup.  Treating at this time with prednisone taper.  Also reviewed following RICE guidelines and adding Tylenol.  Advise follow-up as needed.  Final Clinical Impressions(s) / UC Diagnoses   Final diagnoses:  Acute gout of left ankle, unspecified cause     Discharge Instructions      -You are having a gout flareup of your ankle. - I have sent a prednisone taper.  You can also take prednisone.  Make sure to ice and elevate the ankle as well. - Follow-up as needed.     ED Prescriptions     Medication Sig Dispense Auth. Provider   predniSONE (DELTASONE) 10 MG tablet Take 6 tabs PO on day 1 and decrease by 1 tab daily until complete 21 tablet Danton Clap, PA-C      PDMP not reviewed this encounter.   Danton Clap, PA-C 02/21/21 1133

## 2021-02-21 NOTE — ED Triage Notes (Signed)
Pt presents with sudden onset of left ankle pain overnight.  Ibuprofen is helping.  Was swollen this morning but has improved some.  Is tender to touch and worsening pain with ambulation or weight bearing. No known injury.

## 2021-03-20 ENCOUNTER — Telehealth: Payer: Self-pay | Admitting: Family Medicine

## 2021-03-22 ENCOUNTER — Other Ambulatory Visit: Payer: Self-pay

## 2021-03-22 ENCOUNTER — Ambulatory Visit (INDEPENDENT_AMBULATORY_CARE_PROVIDER_SITE_OTHER): Payer: 59

## 2021-03-22 DIAGNOSIS — Z23 Encounter for immunization: Secondary | ICD-10-CM | POA: Diagnosis not present

## 2021-04-29 ENCOUNTER — Other Ambulatory Visit: Payer: Self-pay | Admitting: Family Medicine

## 2021-04-29 DIAGNOSIS — E785 Hyperlipidemia, unspecified: Secondary | ICD-10-CM

## 2021-04-29 NOTE — Telephone Encounter (Signed)
Requested Prescriptions  Pending Prescriptions Disp Refills   atorvastatin (LIPITOR) 10 MG tablet [Pharmacy Med Name: ATORVASTATIN CALCIUM 10 MG TAB] 90 tablet 2    Sig: TAKE 1 TABLET BY MOUTH ONCE DAILY     Cardiovascular:  Antilipid - Statins Failed - 04/29/2021  7:57 AM      Failed - Total Cholesterol in normal range and within 360 days    Cholesterol, Total  Date Value Ref Range Status  11/21/2020 209 (H) 100 - 199 mg/dL Final         Failed - LDL in normal range and within 360 days    LDL Chol Calc (NIH)  Date Value Ref Range Status  11/21/2020 143 (H) 0 - 99 mg/dL Final         Passed - HDL in normal range and within 360 days    HDL  Date Value Ref Range Status  11/21/2020 47 >39 mg/dL Final         Passed - Triglycerides in normal range and within 360 days    Triglycerides  Date Value Ref Range Status  11/21/2020 107 0 - 149 mg/dL Final         Passed - Patient is not pregnant      Passed - Valid encounter within last 12 months    Recent Outpatient Visits          5 months ago Essential hypertension   [place], Deanna C, MD   10 months ago 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

## 2021-05-24 ENCOUNTER — Encounter: Payer: Self-pay | Admitting: Family Medicine

## 2021-05-24 ENCOUNTER — Other Ambulatory Visit: Payer: Self-pay

## 2021-05-24 ENCOUNTER — Ambulatory Visit (INDEPENDENT_AMBULATORY_CARE_PROVIDER_SITE_OTHER): Payer: 59 | Admitting: Family Medicine

## 2021-05-24 VITALS — BP 122/64 | HR 64 | Ht 73.0 in | Wt 283.0 lb

## 2021-05-24 DIAGNOSIS — Z23 Encounter for immunization: Secondary | ICD-10-CM | POA: Diagnosis not present

## 2021-05-24 DIAGNOSIS — E785 Hyperlipidemia, unspecified: Secondary | ICD-10-CM | POA: Diagnosis not present

## 2021-05-24 DIAGNOSIS — I10 Essential (primary) hypertension: Secondary | ICD-10-CM

## 2021-05-24 DIAGNOSIS — F419 Anxiety disorder, unspecified: Secondary | ICD-10-CM

## 2021-05-24 DIAGNOSIS — F32A Depression, unspecified: Secondary | ICD-10-CM

## 2021-05-24 MED ORDER — LISINOPRIL 30 MG PO TABS: 30.0000 mg | ORAL_TABLET | Freq: Every day | ORAL | 1 refills | Status: DC

## 2021-05-24 MED ORDER — ATORVASTATIN CALCIUM 10 MG PO TABS: 10.0000 mg | ORAL_TABLET | Freq: Every day | ORAL | 1 refills | Status: DC

## 2021-05-24 MED ORDER — HYDROCHLOROTHIAZIDE 25 MG PO TABS: 25.0000 mg | ORAL_TABLET | Freq: Every day | ORAL | 1 refills | Status: DC

## 2021-05-24 MED ORDER — ESCITALOPRAM OXALATE 20 MG PO TABS: 20.0000 mg | ORAL_TABLET | Freq: Every day | ORAL | 1 refills | Status: DC

## 2021-05-24 NOTE — Progress Notes (Signed)
Date:  05/24/2021   Name:  James Bishop   DOB:  [birth year]   MRN:  [id number]   Chief Complaint: Hypertension, Hyperlipidemia, Depression, and Flu Vaccine  Hypertension This is a chronic problem. The current episode started more than 1 year ago. The problem has been gradually improving since onset. The problem is controlled. Pertinent negatives include no anxiety, blurred vision, chest pain, headaches, malaise/fatigue, neck pain, orthopnea, palpitations, peripheral edema, PND, shortness of breath or sweats. There are no associated agents to hypertension. There are no known risk factors for coronary artery disease. Past treatments include ACE inhibitors and diuretics. The current treatment provides moderate improvement. There are no compliance problems.  There is no history of angina, kidney disease, CAD/MI, CVA, heart failure, left ventricular hypertrophy, PVD or retinopathy. There is no history of chronic renal disease, a hypertension causing med or renovascular disease.  Hyperlipidemia This is a chronic problem. The current episode started more than 1 year ago. The problem is controlled. Recent lipid tests were reviewed and are normal. He has no history of chronic renal disease, diabetes, hypothyroidism, liver disease, obesity or nephrotic syndrome. Factors aggravating his hyperlipidemia include thiazides. Pertinent negatives include no chest pain, focal sensory loss, focal weakness, leg pain, myalgias or shortness of breath. Current antihyperlipidemic treatment includes statins. The current treatment provides moderate improvement of lipids. There are no compliance problems.  There are no known risk factors for coronary artery disease.  Depression        This is a chronic problem.  The current episode started more than 1 year ago.   The onset quality is gradual.   The problem has been gradually improving since onset.  Associated symptoms include no decreased concentration, no fatigue, no  helplessness, no hopelessness, does not have insomnia, not irritable, no restlessness, no decreased interest, no appetite change, no body aches, no myalgias, no headaches, no indigestion, not sad and no suicidal ideas.  Past treatments include SSRIs - Selective serotonin reuptake inhibitors.   Pertinent negatives include no hypothyroidism and no anxiety.  Lab Results  Component Value Date   NA 141 11/21/2020   K 5.0 11/21/2020   CO2 24 11/21/2020   GLUCOSE 124 (H) 11/21/2020   BUN 17 11/21/2020   CREATININE 1.02 11/21/2020   CALCIUM 9.9 11/21/2020   EGFR 85 11/21/2020   GFRNONAA 93 05/21/2020   Lab Results  Component Value Date   CHOL 209 (H) 11/21/2020   HDL 47 11/21/2020   LDLCALC 143 (H) 11/21/2020   TRIG 107 11/21/2020   CHOLHDL 4.8 08/18/2016   No results found for: TSH No results found for: HGBA1C No results found for: WBC, HGB, HCT, MCV, PLT Lab Results  Component Value Date   ALT 15 11/21/2020   AST 14 11/21/2020   ALKPHOS 91 11/21/2020   BILITOT 0.7 11/21/2020   No results found for: 25OHVITD2, 25OHVITD3, VD25OH   Review of Systems  Constitutional:  Negative for appetite change, chills, fatigue, fever and malaise/fatigue.  HENT:  Negative for drooling, ear discharge, ear pain and sore throat.   Eyes:  Negative for blurred vision.  Respiratory:  Negative for cough, shortness of breath and wheezing.   Cardiovascular:  Negative for chest pain, palpitations, orthopnea, leg swelling and PND.  Gastrointestinal:  Negative for abdominal pain, blood in stool, constipation, diarrhea and nausea.  Endocrine: Negative for polydipsia.  Genitourinary:  Negative for dysuria, frequency, hematuria and urgency.  Musculoskeletal:  Negative for back pain, myalgias and neck  pain.  Skin:  Negative for rash.  Allergic/Immunologic: Negative for environmental allergies.  Neurological:  Negative for dizziness, focal weakness and headaches.  Hematological:  Does not bruise/bleed easily.   Psychiatric/Behavioral:  Positive for depression. Negative for decreased concentration and suicidal ideas. The patient is not nervous/anxious and does not have insomnia.    Patient Active Problem List   Diagnosis Date Noted   Polyp of sigmoid colon    Class 2 severe obesity due to excess calories with serious comorbidity and body mass index (BMI) of 38.0 to 38.9 in adult [facility]) 09/21/2017   Recurrent major depressive disorder, in partial remission ([place]) 08/18/2016   Encounter for screening colonoscopy 09/27/2014   Anxiety and depression 09/27/2014   Dyslipidemia 09/27/2014   Hypertension 09/27/2014    Allergies  Allergen Reactions   Bee Venom    Penicillins Rash and Other (See Comments)    fever    Past Surgical History:  Procedure Laterality Date   COLONOSCOPY WITH PROPOFOL N/A 10/01/2017   Procedure: COLONOSCOPY WITH PROPOFOL;  Surgeon: Lucilla Lame, MD;  Location: [facility];  Service: Endoscopy;  Laterality: N/A;   POLYPECTOMY  10/01/2017   Procedure: POLYPECTOMY;  Surgeon: Lucilla Lame, MD;  Location: [facility];  Service: Endoscopy;;    Social History   Tobacco Use   Smoking status: Never   Smokeless tobacco: Never  Substance Use Topics   Alcohol use: Not Currently    Alcohol/week: 0.0 standard drinks   Drug use: No     Medication list has been reviewed and updated.  Current Meds  Medication Sig   atorvastatin (LIPITOR) 10 MG tablet TAKE 1 TABLET BY MOUTH ONCE DAILY   escitalopram (LEXAPRO) 20 MG tablet Take 1 tablet (20 mg total) by mouth daily.   hydrochlorothiazide (HYDRODIURIL) 25 MG tablet Take 1 tablet (25 mg total) by mouth daily.   ibuprofen (ADVIL) 800 MG tablet Take 1 tablet (800 mg total) by mouth every 8 (eight) hours as needed.   lisinopril (ZESTRIL) 30 MG tablet Take 1 tablet (30 mg total) by mouth daily.   Omega-3 Fatty Acids (FISH OIL) 500 MG CAPS Take 1 capsule (500 mg total) by mouth daily.   [DISCONTINUED] predniSONE  (DELTASONE) 10 MG tablet Take 6 tabs PO on day 1 and decrease by 1 tab daily until complete    PHQ 2/9 Scores 05/24/2021 11/21/2020 06/15/2020 05/21/2020  PHQ - 2 Score 1 0 0 0  PHQ- 9 Score 1 0 0 0    GAD 7 : Generalized Anxiety Score 05/24/2021 11/21/2020 06/15/2020 05/21/2020  Nervous, Anxious, on Edge 0 0 0 0  Control/stop worrying 0 0 0 0  Worry too much - different things 0 0 0 0  Trouble relaxing 0 0 0 0  Restless 0 0 0 0  Easily annoyed or irritable 1 0 0 0  Afraid - awful might happen 0 0 0 0  Total GAD 7 Score 1 0 0 0  Anxiety Difficulty Not difficult at all - - -    BP Readings from Last 3 Encounters:  05/24/21 122/64  02/21/21 125/81  11/21/20 112/70    Physical Exam Vitals and nursing note reviewed.  Constitutional:      General: He is not irritable. HENT:     Head: Normocephalic.     Right Ear: Tympanic membrane and external ear normal.     Left Ear: Tympanic membrane and external ear normal.     Nose: Nose normal. No congestion or rhinorrhea.  Eyes:     General: No scleral icterus.       Right eye: No discharge.        Left eye: No discharge.     Conjunctiva/sclera: Conjunctivae normal.     Pupils: Pupils are equal, round, and reactive to light.  Neck:     Thyroid: No thyromegaly.     Vascular: No JVD.     Trachea: No tracheal deviation.  Cardiovascular:     Rate and Rhythm: Normal rate and regular rhythm.     Heart sounds: Normal heart sounds. No murmur heard.   No friction rub. No gallop.  Pulmonary:     Effort: No respiratory distress.     Breath sounds: Normal breath sounds. No wheezing, rhonchi or rales.  Abdominal:     General: Bowel sounds are normal.     Palpations: Abdomen is soft. There is no mass.     Tenderness: There is no abdominal tenderness. There is no guarding or rebound.  Musculoskeletal:        General: No tenderness. Normal range of motion.     Cervical back: Normal range of motion and neck supple.  Lymphadenopathy:     Cervical: No  cervical adenopathy.  Skin:    General: Skin is warm.     Findings: No rash.  Neurological:     Mental Status: He is alert and oriented to person, place, and time.     Cranial Nerves: No cranial nerve deficit.     Deep Tendon Reflexes: Reflexes are normal and symmetric.    Wt Readings from Last 3 Encounters:  05/24/21 283 lb (128.4 kg)  11/21/20 282 lb (127.9 kg)  06/15/20 284 lb (128.8 kg)    BP 122/64    Pulse 64    Ht 6' 1" (1.854 m)    Wt 283 lb (128.4 kg)    BMI 37.34 kg/m   Assessment and Plan:  1. Essential hypertension Chronic.  Controlled.  Stable.  Blood pressure 122/64.  Continue hydrochlorothiazide 25 mg and lisinopril 30 mg once a day.  Will check renal function panel for GFR and electrolytes. - hydrochlorothiazide (HYDRODIURIL) 25 MG tablet; Take 1 tablet (25 mg total) by mouth daily.  Dispense: 90 tablet; Refill: 1 - lisinopril (ZESTRIL) 30 MG tablet; Take 1 tablet (30 mg total) by mouth daily.  Dispense: 90 tablet; Refill: 1 - Renal Function Panel  2. Dyslipidemia Chronic.  Controlled.  Stable.  Continue atorvastatin 10 mg once a day.  We will check lipid panel for current status of LDL. - atorvastatin (LIPITOR) 10 MG tablet; Take 1 tablet (10 mg total) by mouth daily.  Dispense: 90 tablet; Refill: 1 - Lipid Panel With LDL/HDL Ratio  3. Anxiety and depression Chronic.  Controlled.  Stable.  PHQ is 1 Gad score is 0 continue Lexapro 20 mg once a day.  We will recheck in 6 months. - escitalopram (LEXAPRO) 20 MG tablet; Take 1 tablet (20 mg total) by mouth daily.  Dispense: 90 tablet; Refill: 1  4. Need for immunization against influenza Discussed and administered - Flu Vaccine QUAD 3moIM (Fluarix, Fluzone & Alfiuria Quad PF)

## 2021-05-25 LAB — RENAL FUNCTION PANEL
Albumin: 4.6 g/dL (ref 3.8–4.9)
BUN/Creatinine Ratio: 13 (ref 9–20)
BUN: 12 mg/dL (ref 6–24)
CO2: 24 mmol/L (ref 20–29)
Calcium: 9.2 mg/dL (ref 8.7–10.2)
Chloride: 100 mmol/L (ref 96–106)
Creatinine, Ser: 0.91 mg/dL (ref 0.76–1.27)
Glucose: 112 mg/dL — ABNORMAL HIGH (ref 70–99)
Phosphorus: 3.2 mg/dL (ref 2.8–4.1)
Potassium: 4.7 mmol/L (ref 3.5–5.2)
Sodium: 138 mmol/L (ref 134–144)
eGFR: 97 mL/min/{1.73_m2} (ref >59)

## 2021-05-25 LAB — LIPID PANEL WITH LDL/HDL RATIO
Cholesterol, Total: 162 mg/dL (ref 100–199)
HDL: 42 mg/dL (ref >39)
LDL Chol Calc (NIH): 98 mg/dL (ref 0–99)
LDL/HDL Ratio: 2.3 ratio (ref 0.0–3.6)
Triglycerides: 123 mg/dL (ref 0–149)
VLDL Cholesterol Cal: 22 mg/dL (ref 5–40)

## 2021-09-21 IMAGING — CR DG CHEST 2V
2 series · 3 of 3 positions shown · non-contrast
Comparison: Chest radiograph dated 02/13/2011.

CLINICAL DATA: 59-year-old male with shortness of breath. Recent
diagnosis of [id number].

EXAM:
CHEST - 2 VIEW

[Series 1: chest pa · 0.14mm/px · 2 of 2 slices shown]
[im 1/2]
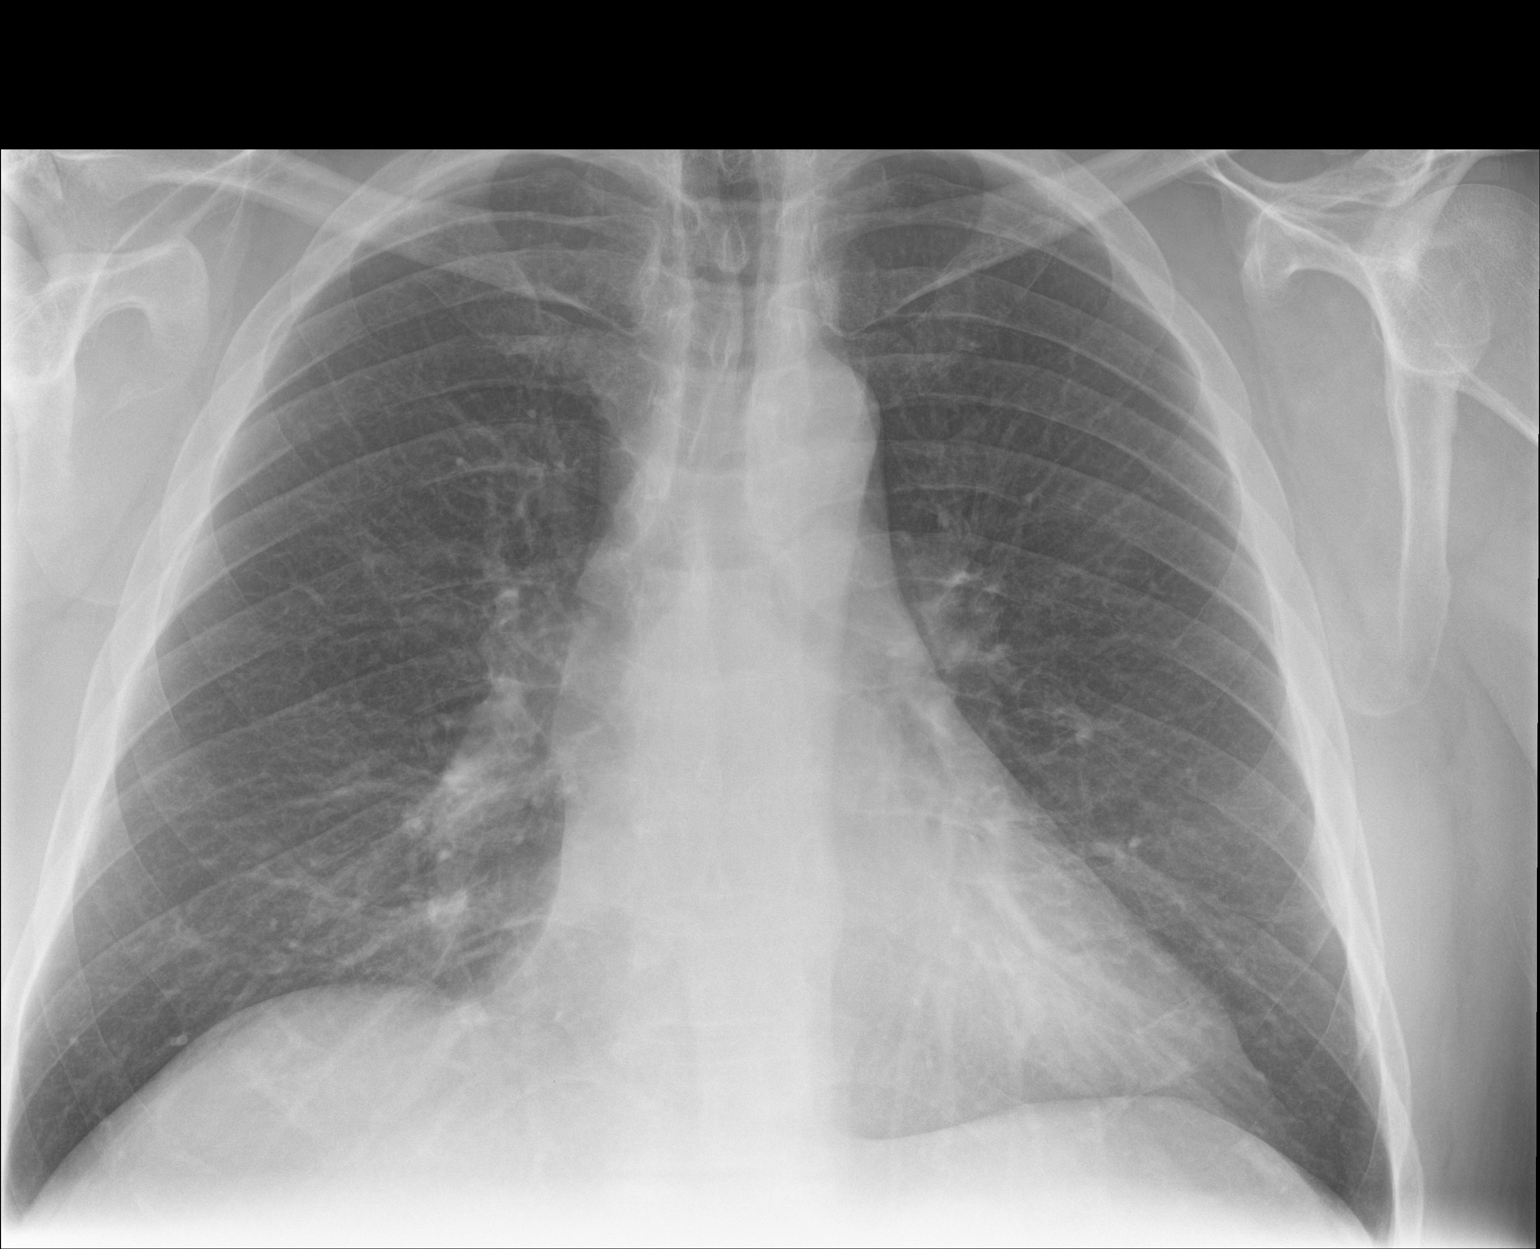
[im 2/2]
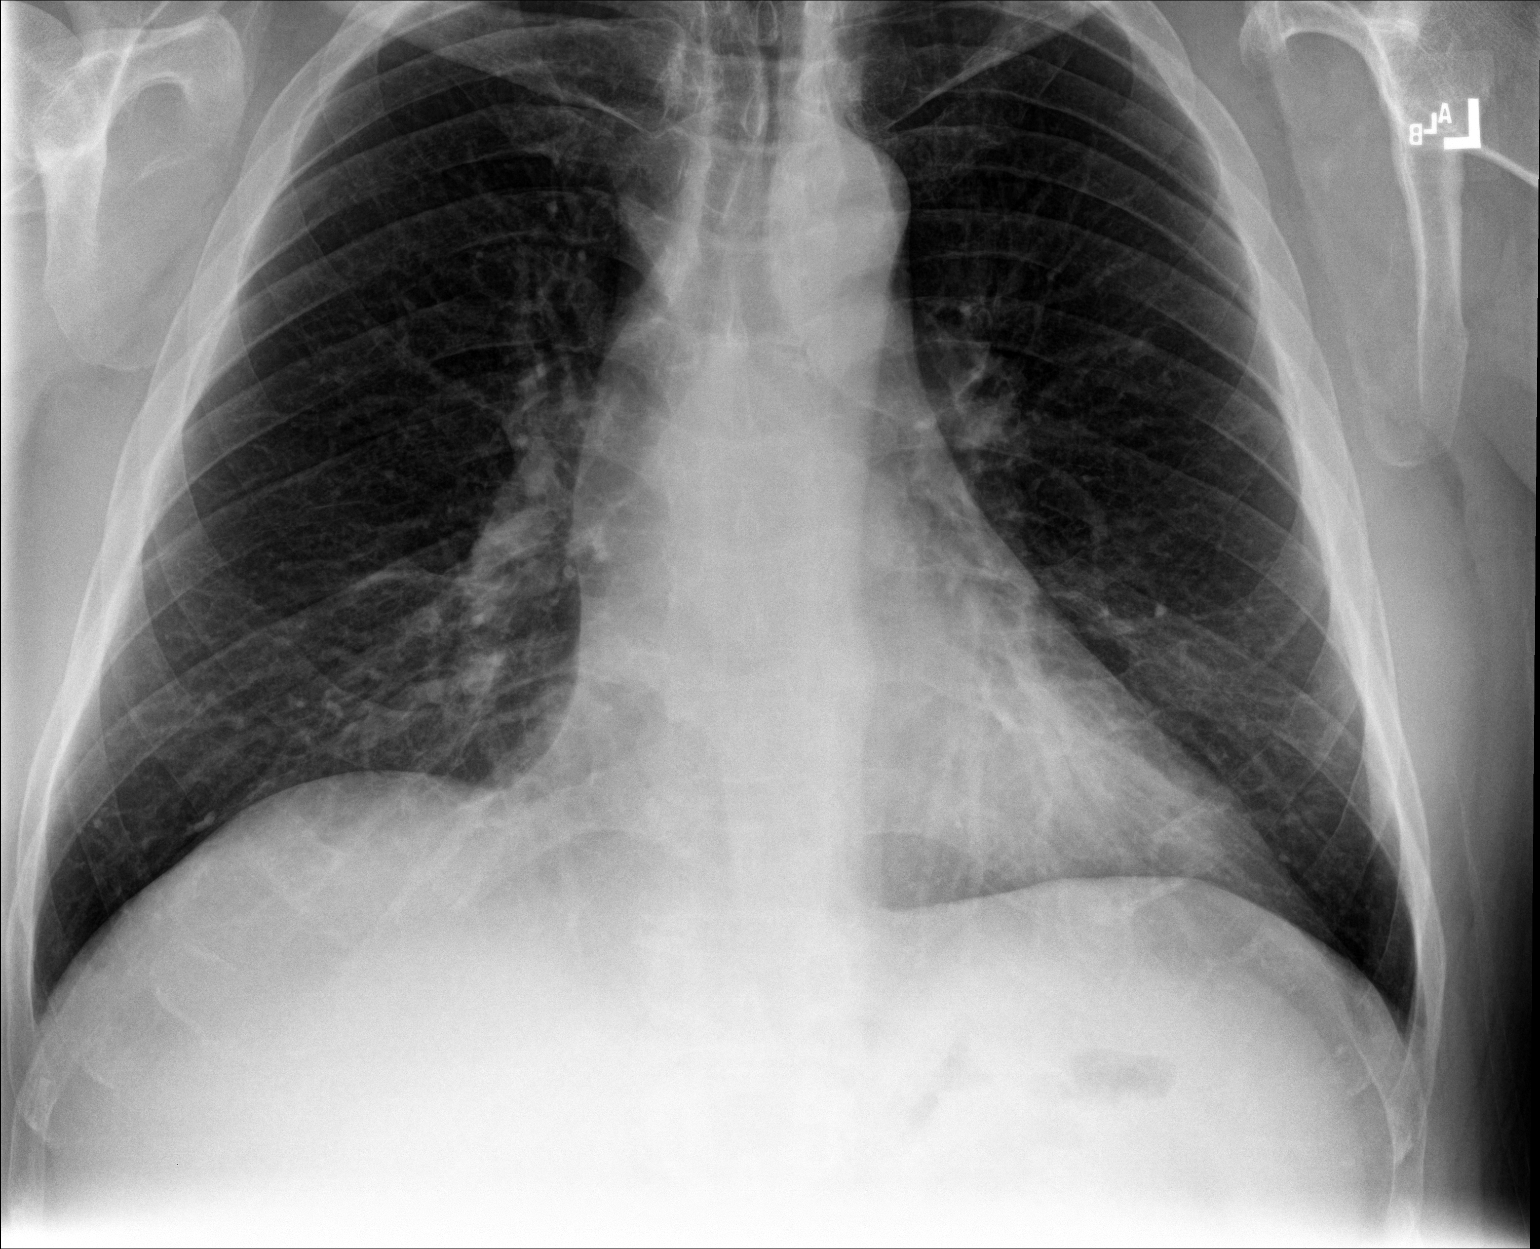

[chest lat]
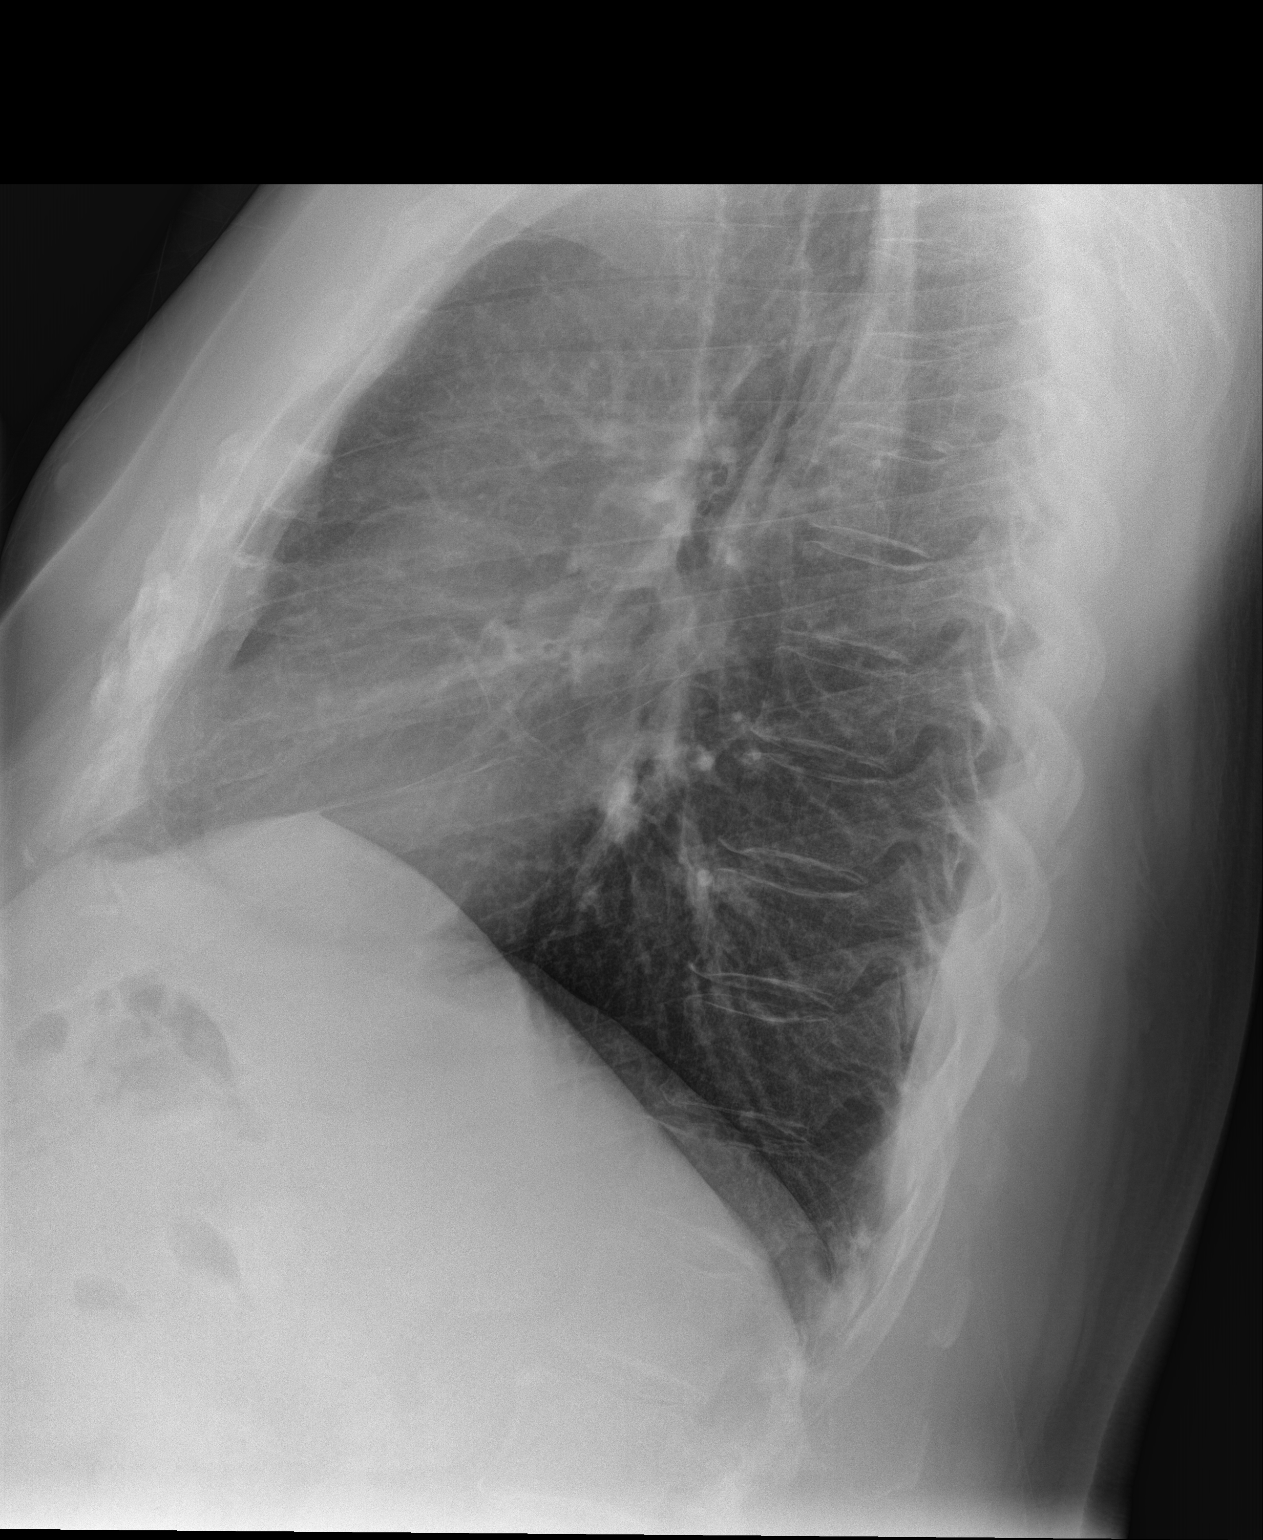

[3 of 3 positions shown; findings below may reference images not displayed]

FINDINGS: The heart size and mediastinal contours are within normal limits.
Both lungs are clear. The visualized skeletal structures are
unremarkable.
IMPRESSION: No active cardiopulmonary disease.

## 2021-09-25 ENCOUNTER — Other Ambulatory Visit: Payer: Self-pay | Admitting: Family Medicine

## 2021-09-25 DIAGNOSIS — M654 Radial styloid tenosynovitis [de Quervain]: Secondary | ICD-10-CM

## 2021-09-25 DIAGNOSIS — M19042 Primary osteoarthritis, left hand: Secondary | ICD-10-CM

## 2021-09-25 NOTE — Telephone Encounter (Signed)
Requested medications are due for refill today.  yes  Requested medications are on the active medications list.  yes  Last refill. 06/15/2020 #90 6 refills  Future visit scheduled.   yes  Notes to clinic.  Medication failed refill protocol d/t missing labs.    Requested Prescriptions  Pending Prescriptions Disp Refills   ibuprofen (ADVIL) 800 MG tablet [Pharmacy Med Name: IBUPROFEN 800 MG TAB] 90 tablet 6    Sig: TAKE 1 TABLET BY MOUTH EVERY 8 HOURS AS NEEDED     Analgesics:  NSAIDS Failed - 09/25/2021  4:08 PM      Failed - Manual Review: Labs are only required if the patient has taken medication for more than 8 weeks.      Failed - HGB in normal range and within 360 days    No results found for: HGB, HGBKUC, HGBPOCKUC, HGBOTHER, TOTHGB, HGBPLASMA       Failed - PLT in normal range and within 360 days    No results found for: PLT, PLTCOUNTKUC, LABPLAT, POCPLA       Failed - HCT in normal range and within 360 days    No results found for: HCT, HCTKUC, SRHCT       Passed - Cr in normal range and within 360 days    Creatinine, Ser  Date Value Ref Range Status  05/24/2021 0.91 0.76 - 1.27 mg/dL Final         Passed - eGFR is 30 or above and within 360 days    GFR calc Af Amer  Date Value Ref Range Status  05/21/2020 107 >59 mL/min/1.73 Final    Comment:    **In accordance with recommendations from the NKF-ASN Task force,**   [facility] is in the process of updating its eGFR calculation to the   2021 CKD-EPI creatinine equation that estimates kidney function   without a race variable.    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

## 2021-11-21 ENCOUNTER — Ambulatory Visit: Payer: 59 | Admitting: Family Medicine

## 2021-12-27 ENCOUNTER — Other Ambulatory Visit: Payer: Self-pay | Admitting: Family Medicine

## 2021-12-27 DIAGNOSIS — I10 Essential (primary) hypertension: Secondary | ICD-10-CM

## 2021-12-27 DIAGNOSIS — F32A Depression, unspecified: Secondary | ICD-10-CM

## 2022-01-17 ENCOUNTER — Other Ambulatory Visit: Payer: Self-pay | Admitting: Family Medicine

## 2022-01-17 DIAGNOSIS — I10 Essential (primary) hypertension: Secondary | ICD-10-CM

## 2022-01-17 DIAGNOSIS — F419 Anxiety disorder, unspecified: Secondary | ICD-10-CM

## 2022-01-20 NOTE — Telephone Encounter (Signed)
Requested medications are due for refill today.  yes  Requested medications are on the active medications list.  yes  Last refill. All refilled 12/27/2021 #15 0rf  Future visit scheduled.   no  Notes to clinic.  Courtesy refill already given.     Requested Prescriptions  Pending Prescriptions Disp Refills   escitalopram (LEXAPRO) 20 MG tablet [Pharmacy Med Name: escitalopram 20 mg tablet] 15 tablet 0    Sig: TAKE 1 TABLET BY MOUTH ONCE DAILY     Psychiatry:  Antidepressants - SSRI Failed - 01/17/2022  4:44 PM      Failed - Valid encounter within last 6 months    Recent Outpatient Visits           8 months ago Essential hypertension   [facility], MD   1 year ago Essential hypertension   [place] [facility] [place], Deanna C, MD   1 year ago 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   [place] [facility] [place], Deanna C, MD   1 year ago Essential hypertension   [place] [facility] [place], Deanna C, MD   2 years ago Anxiety and depression   [place] [facility], MD              Passed - Completed PHQ-2 or PHQ-9 in the last 360 days       lisinopril (ZESTRIL) 30 MG tablet [Pharmacy Med Name: lisinopril 30 mg tablet] 15 tablet 0    Sig: TAKE 1 TABLET BY MOUTH ONCE DAILY     Cardiovascular:  ACE Inhibitors Failed - 01/17/2022  4:44 PM      Failed - Cr in normal range and within 180 days    Creatinine, Ser  Date Value Ref Range Status  05/24/2021 0.91 0.76 - 1.27 mg/dL Final         Failed - K in normal range and within 180 days    Potassium  Date Value Ref Range Status  05/24/2021 4.7 3.5 - 5.2 mmol/L Final         Failed - Valid encounter within last 6 months    Recent Outpatient  Visits           8 months ago Essential hypertension   [place] [facility] at [place], Deanna C, MD   1 year ago Essential hypertension   [place] [facility] [place], Deanna C, MD   1 year ago 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   [place] [facility] [place], Deanna C, MD   1 year ago Essential hypertension   [place] [facility] at [place], Deanna C, MD   2 years ago Anxiety and depression    [facility], MD              Passed - Patient is not pregnant      Passed - Last BP in normal range    BP Readings from Last 1 Encounters:  05/24/21 122/64          hydrochlorothiazide (HYDRODIURIL) 25 MG tablet [Pharmacy Med Name:  hydrochlorothiazide 25 mg tablet] 15 tablet 0    Sig: TAKE 1 TABLET BY MOUTH ONCE DAILY     Cardiovascular: Diuretics - Thiazide Failed - 01/17/2022  4:44 PM      Failed - Cr in normal range and within 180 days    Creatinine, Ser  Date Value Ref Range Status  05/24/2021 0.91 0.76 - 1.27 mg/dL Final         Failed - K in normal range and within 180 days    Potassium  Date Value Ref Range Status  05/24/2021 4.7 3.5 - 5.2 mmol/L Final         Failed - Na in normal range and within 180 days    Sodium  Date Value Ref Range Status  05/24/2021 138 134 - 144 mmol/L Final         Failed - Valid encounter within last 6 months    Recent Outpatient Visits           8 months ago Essential hypertension   [place] [facility] [place], Deanna C, MD   1 year ago Essential hypertension   [place] [facility] [place], Deanna C, MD   1 year ago 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

## 2022-01-31 ENCOUNTER — Other Ambulatory Visit: Payer: Self-pay

## 2022-01-31 DIAGNOSIS — F32A Depression, unspecified: Secondary | ICD-10-CM

## 2022-01-31 DIAGNOSIS — I10 Essential (primary) hypertension: Secondary | ICD-10-CM

## 2022-01-31 DIAGNOSIS — E785 Hyperlipidemia, unspecified: Secondary | ICD-10-CM

## 2022-01-31 MED ORDER — ESCITALOPRAM OXALATE 20 MG PO TABS: 20.0000 mg | ORAL_TABLET | Freq: Every day | ORAL | 0 refills | Status: DC

## 2022-01-31 MED ORDER — HYDROCHLOROTHIAZIDE 25 MG PO TABS: 25.0000 mg | ORAL_TABLET | Freq: Every day | ORAL | 0 refills | Status: DC

## 2022-01-31 MED ORDER — LISINOPRIL 30 MG PO TABS: 30.0000 mg | ORAL_TABLET | Freq: Every day | ORAL | 0 refills | Status: DC

## 2022-01-31 MED ORDER — ATORVASTATIN CALCIUM 10 MG PO TABS: 10.0000 mg | ORAL_TABLET | Freq: Every day | ORAL | 0 refills | Status: DC

## 2022-02-19 ENCOUNTER — Encounter: Payer: Self-pay | Admitting: Family Medicine

## 2022-02-19 ENCOUNTER — Ambulatory Visit: Payer: 59 | Admitting: Family Medicine

## 2022-02-19 VITALS — BP 134/90 | HR 76 | Ht 73.0 in | Wt 282.0 lb

## 2022-02-19 DIAGNOSIS — I10 Essential (primary) hypertension: Secondary | ICD-10-CM

## 2022-02-19 DIAGNOSIS — Z23 Encounter for immunization: Secondary | ICD-10-CM

## 2022-02-19 DIAGNOSIS — F419 Anxiety disorder, unspecified: Secondary | ICD-10-CM

## 2022-02-19 DIAGNOSIS — E785 Hyperlipidemia, unspecified: Secondary | ICD-10-CM

## 2022-02-19 DIAGNOSIS — F32A Depression, unspecified: Secondary | ICD-10-CM

## 2022-02-19 MED ORDER — ATORVASTATIN CALCIUM 10 MG PO TABS: 10.0000 mg | ORAL_TABLET | Freq: Every day | ORAL | 1 refills | Status: DC

## 2022-02-19 MED ORDER — LISINOPRIL 30 MG PO TABS: 30.0000 mg | ORAL_TABLET | Freq: Every day | ORAL | 1 refills | Status: DC

## 2022-02-19 MED ORDER — ESCITALOPRAM OXALATE 20 MG PO TABS: 20.0000 mg | ORAL_TABLET | Freq: Every day | ORAL | 1 refills | Status: DC

## 2022-02-19 MED ORDER — HYDROCHLOROTHIAZIDE 25 MG PO TABS: 25.0000 mg | ORAL_TABLET | Freq: Every day | ORAL | 1 refills | Status: DC

## 2022-02-19 NOTE — Progress Notes (Signed)
Date:  02/19/2022   Name:  James Bishop   DOB:  Apr 24, 1960   MRN:  [id number]   Chief Complaint: Hypertension, Depression, Hyperlipidemia, and Flu Vaccine  Hypertension This is a chronic problem. The current episode started more than 1 year ago. The problem has been gradually improving since onset. The problem is controlled. Pertinent negatives include no chest pain, headaches, orthopnea, palpitations or shortness of breath. Past treatments include ACE inhibitors and diuretics. The current treatment provides moderate improvement. There are no compliance problems.   Depression        This is a chronic problem.  The onset quality is gradual.   The problem has been gradually improving since onset.  Associated symptoms include no decreased concentration, no fatigue, no helplessness, no hopelessness, does not have insomnia, not irritable, no restlessness, no decreased interest, no appetite change, no body aches, no myalgias, no headaches, no indigestion, not sad and no suicidal ideas.  Compliance with treatment is variable. Hyperlipidemia This is a chronic problem. The current episode started more than 1 year ago. The problem is controlled. Recent lipid tests were reviewed and are normal. Pertinent negatives include no chest pain, myalgias or shortness of breath. Current antihyperlipidemic treatment includes statins. The current treatment provides moderate improvement of lipids. There are no compliance problems.  Risk factors for coronary artery disease include dyslipidemia and hypertension.    Lab Results  Component Value Date   NA 138 05/24/2021   K 4.7 05/24/2021   CO2 24 05/24/2021   GLUCOSE 112 (H) 05/24/2021   BUN 12 05/24/2021   CREATININE 0.91 05/24/2021   CALCIUM 9.2 05/24/2021   EGFR 97 05/24/2021   GFRNONAA 93 05/21/2020   Lab Results  Component Value Date   CHOL 162 05/24/2021   HDL 42 05/24/2021   LDLCALC 98 05/24/2021   TRIG 123 05/24/2021   CHOLHDL 4.8 08/18/2016    No results found for: "TSH" No results found for: "HGBA1C" No results found for: "WBC", "HGB", "HCT", "MCV", "PLT" Lab Results  Component Value Date   ALT 15 11/21/2020   AST 14 11/21/2020   ALKPHOS 91 11/21/2020   BILITOT 0.7 11/21/2020   No results found for: "25OHVITD2", "25OHVITD3", "VD25OH"   Review of Systems  Constitutional:  Negative for appetite change and fatigue.  HENT:  Negative for postnasal drip and rhinorrhea.   Respiratory:  Negative for shortness of breath.   Cardiovascular:  Negative for chest pain, palpitations and orthopnea.  Musculoskeletal:  Negative for myalgias.  Neurological:  Negative for headaches.  Psychiatric/Behavioral:  Positive for depression. Negative for decreased concentration and suicidal ideas. The patient does not have insomnia.     Patient Active Problem List   Diagnosis Date Noted   Polyp of sigmoid colon    Class 2 severe obesity due to excess calories with serious comorbidity and body mass index (BMI) of 38.0 to 38.9 in adult [facility]) 09/21/2017   Recurrent major depressive disorder, in partial remission ([place]) 08/18/2016   Encounter for screening colonoscopy 09/27/2014   Anxiety and depression 09/27/2014   Dyslipidemia 09/27/2014   Hypertension 09/27/2014    Allergies  Allergen Reactions   Bee Venom    Penicillins Rash and Other (See Comments)    fever    Past Surgical History:  Procedure Laterality Date   COLONOSCOPY WITH PROPOFOL N/A 10/01/2017   Procedure: COLONOSCOPY WITH PROPOFOL;  Surgeon: Lucilla Lame, MD;  Location: [place];  Service: Endoscopy;  Laterality: N/A;   POLYPECTOMY  10/01/2017   Procedure: POLYPECTOMY;  Surgeon: Lucilla Lame, MD;  Location: [facility];  Service: Endoscopy;;    Social History   Tobacco Use   Smoking status: Never   Smokeless tobacco: Never  Substance Use Topics   Alcohol use: Not Currently    Alcohol/week: 0.0 standard drinks of alcohol   Drug use: No      Medication list has been reviewed and updated.  Current Meds  Medication Sig   atorvastatin (LIPITOR) 10 MG tablet Take 1 tablet (10 mg total) by mouth daily.   escitalopram (LEXAPRO) 20 MG tablet Take 1 tablet (20 mg total) by mouth daily.   hydrochlorothiazide (HYDRODIURIL) 25 MG tablet Take 1 tablet (25 mg total) by mouth daily.   lisinopril (ZESTRIL) 30 MG tablet Take 1 tablet (30 mg total) by mouth daily.   Omega-3 Fatty Acids (FISH OIL) 500 MG CAPS Take 1 capsule (500 mg total) by mouth daily.       02/19/2022   10:21 AM 05/24/2021    8:54 AM 11/21/2020    8:50 AM 06/15/2020    8:55 AM  GAD 7 : Generalized Anxiety Score  Nervous, Anxious, on Edge 0 0 0 0  Control/stop worrying 0 0 0 0  Worry too much - different things 0 0 0 0  Trouble relaxing 0 0 0 0  Restless 0 0 0 0  Easily annoyed or irritable 0 1 0 0  Afraid - awful might happen 0 0 0 0  Total GAD 7 Score 0 1 0 0  Anxiety Difficulty Not difficult at all Not difficult at all         02/19/2022   10:21 AM 05/24/2021    8:53 AM 11/21/2020    8:50 AM  Depression screen PHQ 2/9  Decreased Interest 0 0 0  Down, Depressed, Hopeless 0 1 0  PHQ - 2 Score 0 1 0  Altered sleeping 0 0 0  Tired, decreased energy 0 0 0  Change in appetite 0 0 0  Feeling bad or failure about yourself  0 0 0  Trouble concentrating 0 0 0  Moving slowly or fidgety/restless 0 0 0  Suicidal thoughts 0 0 0  PHQ-9 Score 0 1 0  Difficult doing work/chores Not difficult at all Not difficult at all     BP Readings from Last 3 Encounters:  02/19/22 (!) 134/90  05/24/21 122/64  02/21/21 125/81    Physical Exam Vitals and nursing note reviewed.  Constitutional:      General: He is not irritable. HENT:     Head: Normocephalic.     Right Ear: Tympanic membrane and external ear normal.     Left Ear: Tympanic membrane and external ear normal.     Nose: Nose normal.  Eyes:     General: No scleral icterus.       Right eye: No discharge.         Left eye: No discharge.     Conjunctiva/sclera: Conjunctivae normal.     Pupils: Pupils are equal, round, and reactive to light.  Neck:     Thyroid: No thyromegaly.     Vascular: No JVD.     Trachea: No tracheal deviation.  Cardiovascular:     Rate and Rhythm: Normal rate and regular rhythm.     Heart sounds: Normal heart sounds, S1 normal and S2 normal. No murmur heard.    No systolic murmur is present.     No diastolic murmur is  present.     No friction rub. No gallop. No S3 or S4 sounds.  Pulmonary:     Effort: No respiratory distress.     Breath sounds: Normal breath sounds. No wheezing or rales.  Abdominal:     General: Bowel sounds are normal.     Palpations: Abdomen is soft. There is no mass.     Tenderness: There is no abdominal tenderness. There is no guarding or rebound.  Musculoskeletal:        General: No tenderness. Normal range of motion.     Cervical back: Normal range of motion and neck supple.  Lymphadenopathy:     Cervical: No cervical adenopathy.  Skin:    General: Skin is warm.     Findings: No rash.  Neurological:     Mental Status: He is alert and oriented to person, place, and time.     Cranial Nerves: No cranial nerve deficit.     Deep Tendon Reflexes: Reflexes are normal and symmetric.     Wt Readings from Last 3 Encounters:  02/19/22 282 lb (127.9 kg)  05/24/21 283 lb (128.4 kg)  11/21/20 282 lb (127.9 kg)    BP (!) 134/90 (BP Location: Right Arm, Cuff Size: Large)   Pulse 76   Ht _0  (1.854 m)   Wt 282 lb (127.9 kg)   SpO2 98%   BMI 37.21 kg/m   Assessment and Plan:  1. Essential hypertension Chronic.  Uncontrolled.  Stable.  Because patient has not taken his medication this morning.  Continue hydrochlorothiazide 25 mg once a day and lisinopril 30 mg once a day.  Review of previous renal function panel is acceptable. - hydrochlorothiazide (HYDRODIURIL) 25 MG tablet; Take 1 tablet (25 mg total) by mouth daily.  Dispense: 90 tablet;  Refill: 1 - lisinopril (ZESTRIL) 30 MG tablet; Take 1 tablet (30 mg total) by mouth daily.  Dispense: 90 tablet; Refill: 1  2. Dyslipidemia .  Controlled.  Stable.  Continue atorvastatin 10 mg once a day.  Review of previous lipid panel is acceptable. - atorvastatin (LIPITOR) 10 MG tablet; Take 1 tablet (10 mg total) by mouth daily.  Dispense: 90 tablet; Refill: 1  3. Anxiety and depression .  Controlled.  Stable.  PHQ is 0.  GAD score is 0.  Continue S-Citalopram 20 mg once a day.  We will recheck in 6 months. - escitalopram (LEXAPRO) 20 MG tablet; Take 1 tablet (20 mg total) by mouth daily.  Dispense: 90 tablet; Refill: 1  4. Need for immunization against influenza Discussed and administered. - Flu Vaccine QUAD 28moIM (Fluarix, Fluzone & Alfiuria Quad PF)    DOtilio Miu MD

## 2022-03-06 ENCOUNTER — Ambulatory Visit: Payer: Self-pay | Admitting: Family Medicine

## 2022-08-20 ENCOUNTER — Ambulatory Visit: Payer: Self-pay | Admitting: Family Medicine

## 2022-09-16 ENCOUNTER — Other Ambulatory Visit: Payer: Self-pay | Admitting: Family Medicine

## 2022-09-16 DIAGNOSIS — F419 Anxiety disorder, unspecified: Secondary | ICD-10-CM

## 2022-09-16 DIAGNOSIS — I10 Essential (primary) hypertension: Secondary | ICD-10-CM

## 2022-09-16 DIAGNOSIS — E785 Hyperlipidemia, unspecified: Secondary | ICD-10-CM

## 2022-10-17 ENCOUNTER — Encounter: Payer: Self-pay | Admitting: Family Medicine

## 2022-10-17 ENCOUNTER — Ambulatory Visit (INDEPENDENT_AMBULATORY_CARE_PROVIDER_SITE_OTHER): Payer: Self-pay | Admitting: Family Medicine

## 2022-10-17 VITALS — BP 120/70 | HR 83 | Ht 73.0 in | Wt 287.0 lb

## 2022-10-17 DIAGNOSIS — F419 Anxiety disorder, unspecified: Secondary | ICD-10-CM

## 2022-10-17 DIAGNOSIS — F32A Depression, unspecified: Secondary | ICD-10-CM

## 2022-10-17 DIAGNOSIS — I10 Essential (primary) hypertension: Secondary | ICD-10-CM

## 2022-10-17 DIAGNOSIS — E785 Hyperlipidemia, unspecified: Secondary | ICD-10-CM

## 2022-10-17 MED ORDER — LISINOPRIL 30 MG PO TABS: 30.0000 mg | ORAL_TABLET | Freq: Every day | ORAL | 1 refills | Status: DC

## 2022-10-17 MED ORDER — HYDROCHLOROTHIAZIDE 25 MG PO TABS: 25.0000 mg | ORAL_TABLET | Freq: Every day | ORAL | 1 refills | Status: DC

## 2022-10-17 MED ORDER — ATORVASTATIN CALCIUM 10 MG PO TABS: 10.0000 mg | ORAL_TABLET | Freq: Every day | ORAL | 1 refills | Status: DC

## 2022-10-17 MED ORDER — ESCITALOPRAM OXALATE 20 MG PO TABS: 20.0000 mg | ORAL_TABLET | Freq: Every day | ORAL | 1 refills | Status: DC

## 2022-10-17 NOTE — Progress Notes (Unsigned)
Date:  10/17/2022   Name:  James Bishop   DOB:  [birth year]   MRN:  [id number]   Chief Complaint: Hypertension, Hyperlipidemia, and Depression  Hypertension This is a chronic problem. The current episode started more than 1 year ago. The problem is controlled. Pertinent negatives include no blurred vision, chest pain, headaches, orthopnea, palpitations, PND or shortness of breath. There are no associated agents to hypertension. Risk factors for coronary artery disease include dyslipidemia. Past treatments include diuretics and ACE inhibitors. The current treatment provides moderate improvement. There are no compliance problems.  There is no history of angina, CAD/MI or CVA. There is no history of chronic renal disease, a hypertension causing med or renovascular disease.  Hyperlipidemia This is a chronic problem. The current episode started more than 1 year ago. The problem is controlled. Recent lipid tests were reviewed and are normal. He has no history of chronic renal disease. Pertinent negatives include no chest pain, focal sensory loss, focal weakness, myalgias or shortness of breath. Current antihyperlipidemic treatment includes statins. The current treatment provides moderate improvement of lipids. There are no compliance problems.   Depression        This is a chronic problem.  The current episode started more than 1 year ago.   The problem occurs intermittently.  The problem has been gradually improving since onset.  Associated symptoms include no decreased concentration, no fatigue, no helplessness, no hopelessness, does not have insomnia, not irritable, no restlessness, no decreased interest, no appetite change, no body aches, no myalgias, no headaches, no indigestion, not sad and no suicidal ideas.     The symptoms are aggravated by nothing.  Past treatments include SSRIs - Selective serotonin reuptake inhibitors.  Previous treatment provided mild relief.   Lab Results  Component  Value Date   NA 138 05/24/2021   K 4.7 05/24/2021   CO2 24 05/24/2021   GLUCOSE 112 (H) 05/24/2021   BUN 12 05/24/2021   CREATININE 0.91 05/24/2021   CALCIUM 9.2 05/24/2021   EGFR 97 05/24/2021   GFRNONAA 93 05/21/2020   Lab Results  Component Value Date   CHOL 162 05/24/2021   HDL 42 05/24/2021   LDLCALC 98 05/24/2021   TRIG 123 05/24/2021   CHOLHDL 4.8 08/18/2016   No results found for: "TSH" No results found for: "HGBA1C" No results found for: "WBC", "HGB", "HCT", "MCV", "PLT" Lab Results  Component Value Date   ALT 15 11/21/2020   AST 14 11/21/2020   ALKPHOS 91 11/21/2020   BILITOT 0.7 11/21/2020   No results found for: "25OHVITD2", "25OHVITD3", "VD25OH"   Review of Systems  Constitutional:  Negative for appetite change and fatigue.  HENT:  Negative for trouble swallowing.   Eyes:  Negative for blurred vision and visual disturbance.  Respiratory:  Negative for cough, shortness of breath and wheezing.   Cardiovascular:  Negative for chest pain, palpitations, orthopnea and PND.  Gastrointestinal:  Negative for abdominal pain and blood in stool.  Endocrine: Negative for polydipsia and polyuria.  Genitourinary:  Negative for difficulty urinating.  Musculoskeletal:  Negative for myalgias.  Neurological:  Negative for focal weakness and headaches.  Psychiatric/Behavioral:  Positive for depression. Negative for decreased concentration and suicidal ideas. The patient does not have insomnia.     Patient Active Problem List   Diagnosis Date Noted   Polyp of sigmoid colon    Class 2 severe obesity due to excess calories with serious comorbidity and body mass index (BMI) of 38.0  to 38.9 in adult [facility]) 09/21/2017   Recurrent major depressive disorder, in partial remission ([facility]) 08/18/2016   Encounter for screening colonoscopy 09/27/2014   Anxiety and depression 09/27/2014   Dyslipidemia 09/27/2014   Hypertension 09/27/2014    Allergies  Allergen Reactions   Bee Venom     Penicillins Rash and Other (See Comments)    fever    Past Surgical History:  Procedure Laterality Date   COLONOSCOPY WITH PROPOFOL N/A 10/01/2017   Procedure: COLONOSCOPY WITH PROPOFOL;  Surgeon: Midge Minium, MD;  Location: [facility];  Service: Endoscopy;  Laterality: N/A;   POLYPECTOMY  10/01/2017   Procedure: POLYPECTOMY;  Surgeon: Midge Minium, MD;  Location: [facility];  Service: Endoscopy;;    Social History   Tobacco Use   Smoking status: Never   Smokeless tobacco: Never  Substance Use Topics   Alcohol use: Not Currently    Alcohol/week: 0.0 standard drinks of alcohol   Drug use: No     Medication list has been reviewed and updated.  Current Meds  Medication Sig   atorvastatin (LIPITOR) 10 MG tablet TAKE ONE TABLET BY MOUTH ONCE DAILY   escitalopram (LEXAPRO) 20 MG tablet TAKE ONE TABLET BY MOUTH ONCE DAILY   hydrochlorothiazide (HYDRODIURIL) 25 MG tablet TAKE ONE TABLET BY MOUTH ONCE DAILY   lisinopril (ZESTRIL) 30 MG tablet TAKE ONE TABLET BY MOUTH ONCE DAILY   Omega-3 Fatty Acids (FISH OIL) 500 MG CAPS Take 1 capsule (500 mg total) by mouth daily.       10/17/2022    4:06 PM 02/19/2022   10:21 AM 05/24/2021    8:54 AM 11/21/2020    8:50 AM  GAD 7 : Generalized Anxiety Score  Nervous, Anxious, on Edge 0 0 0 0  Control/stop worrying 0 0 0 0  Worry too much - different things 0 0 0 0  Trouble relaxing 0 0 0 0  Restless 0 0 0 0  Easily annoyed or irritable 0 0 1 0  Afraid - awful might happen 0 0 0 0  Total GAD 7 Score 0 0 1 0  Anxiety Difficulty Not difficult at all Not difficult at all Not difficult at all        10/17/2022    4:06 PM 02/19/2022   10:21 AM 05/24/2021    8:53 AM  Depression screen PHQ 2/9  Decreased Interest 0 0 0  Down, Depressed, Hopeless 0 0 1  PHQ - 2 Score 0 0 1  Altered sleeping 0 0 0  Tired, decreased energy 0 0 0  Change in appetite 0 0 0  Feeling bad or failure about yourself  0 0 0  Trouble concentrating 0  0 0  Moving slowly or fidgety/restless 0 0 0  Suicidal thoughts 0 0 0  PHQ-9 Score 0 0 1  Difficult doing work/chores Not difficult at all Not difficult at all Not difficult at all    BP Readings from Last 3 Encounters:  10/17/22 120/70  02/19/22 (!) 134/90  05/24/21 122/64    Physical Exam Vitals and nursing note reviewed.  Constitutional:      General: He is not irritable. HENT:     Head: Normocephalic.     Right Ear: Tympanic membrane, ear canal and external ear normal.     Left Ear: Tympanic membrane, ear canal and external ear normal.     Nose: Nose normal. No congestion or rhinorrhea.     Mouth/Throat:     Pharynx: No  oropharyngeal exudate or posterior oropharyngeal erythema.  Eyes:     General: No scleral icterus.       Right eye: No discharge.        Left eye: No discharge.     Conjunctiva/sclera: Conjunctivae normal.     Pupils: Pupils are equal, round, and reactive to light.  Neck:     Thyroid: No thyromegaly.     Vascular: No JVD.     Trachea: No tracheal deviation.  Cardiovascular:     Rate and Rhythm: Normal rate and regular rhythm.     Heart sounds: Normal heart sounds. No murmur heard.    No friction rub. No gallop.  Pulmonary:     Effort: No respiratory distress.     Breath sounds: Normal breath sounds. No wheezing, rhonchi or rales.  Abdominal:     General: Bowel sounds are normal.     Palpations: Abdomen is soft. There is no mass.     Tenderness: There is no abdominal tenderness. There is no guarding or rebound.  Musculoskeletal:        General: No tenderness. Normal range of motion.     Cervical back: Normal range of motion and neck supple.  Lymphadenopathy:     Cervical: No cervical adenopathy.  Skin:    General: Skin is warm.     Findings: No rash.  Neurological:     Mental Status: He is alert and oriented to person, place, and time.     Cranial Nerves: No cranial nerve deficit.     Deep Tendon Reflexes: Reflexes are normal and symmetric.      Wt Readings from Last 3 Encounters:  10/17/22 287 lb (130.2 kg)  02/19/22 282 lb (127.9 kg)  05/24/21 283 lb (128.4 kg)    BP 120/70   Pulse 83   Ht 6\' 1"  (1.854 m)   Wt 287 lb (130.2 kg)   SpO2 99%   BMI 37.87 kg/m   Assessment and Plan:  1. Dyslipidemia Chronic.  Controlled.  Stable.  Asymptomatic.  Tolerating medication well without myalgias.  Continue atorvastatin 10 mg once a day.  Will check lipid panel for current level of control. - atorvastatin (LIPITOR) 10 MG tablet; Take 1 tablet (10 mg total) by mouth daily.  Dispense: 90 tablet; Refill: 1 - Lipid Panel With LDL/HDL Ratio  2. Anxiety and depression Chronic.  Controlled.  Stable.  PHQ is 0.  GAD score is 0.  Continue escitalopram 20 mg once a day.  Will recheck in 6 months. - escitalopram (LEXAPRO) 20 MG tablet; Take 1 tablet (20 mg total) by mouth daily.  Dispense: 90 tablet; Refill: 1  3. Essential hypertension Chronic.  Controlled.  Stable.  Blood pressure 120/70.  Asymptomatic.  Tolerating medication well.  Continue hydrochlorothiazide 25 mg once a day and lisinopril 30 mg once a day.  Will check renal function panel for current electrolytes and GFR.  Will recheck patient in 6 months. - hydrochlorothiazide (HYDRODIURIL) 25 MG tablet; Take 1 tablet (25 mg total) by mouth daily.  Dispense: 90 tablet; Refill: 1 - lisinopril (ZESTRIL) 30 MG tablet; Take 1 tablet (30 mg total) by mouth daily.  Dispense: 90 tablet; Refill: 1 - Renal Function Panel    Elizabeth Sauer, MD

## 2022-10-18 ENCOUNTER — Encounter: Payer: Self-pay | Admitting: Family Medicine

## 2022-10-18 LAB — RENAL FUNCTION PANEL
Albumin: 4.2 g/dL (ref 3.8–4.9)
BUN/Creatinine Ratio: 21 (ref 10–24)
BUN: 20 mg/dL (ref 8–27)
CO2: 24 mmol/L (ref 20–29)
Calcium: 9.3 mg/dL (ref 8.6–10.2)
Chloride: 103 mmol/L (ref 96–106)
Creatinine, Ser: 0.97 mg/dL (ref 0.76–1.27)
Glucose: 117 mg/dL — ABNORMAL HIGH (ref 70–99)
Phosphorus: 4.4 mg/dL — ABNORMAL HIGH (ref 2.8–4.1)
Potassium: 4.2 mmol/L (ref 3.5–5.2)
Sodium: 141 mmol/L (ref 134–144)
eGFR: 89 mL/min/{1.73_m2} (ref >59)

## 2022-10-18 LAB — LIPID PANEL WITH LDL/HDL RATIO
Cholesterol, Total: 175 mg/dL (ref 100–199)
HDL: 35 mg/dL — ABNORMAL LOW (ref >39)
LDL Chol Calc (NIH): 83 mg/dL (ref 0–99)
LDL/HDL Ratio: 2.4 ratio (ref 0.0–3.6)
Triglycerides: 349 mg/dL — ABNORMAL HIGH (ref 0–149)
VLDL Cholesterol Cal: 57 mg/dL — ABNORMAL HIGH (ref 5–40)

## 2023-03-31 ENCOUNTER — Ambulatory Visit: Payer: Self-pay | Admitting: Family Medicine

## 2023-04-13 ENCOUNTER — Other Ambulatory Visit: Payer: Self-pay | Admitting: Family Medicine

## 2023-04-13 DIAGNOSIS — E785 Hyperlipidemia, unspecified: Secondary | ICD-10-CM

## 2023-04-13 DIAGNOSIS — F32A Depression, unspecified: Secondary | ICD-10-CM

## 2023-04-13 DIAGNOSIS — I10 Essential (primary) hypertension: Secondary | ICD-10-CM

## 2023-09-03 ENCOUNTER — Ambulatory Visit: Admitting: Family Medicine

## 2023-09-03 ENCOUNTER — Encounter: Payer: Self-pay | Admitting: Family Medicine

## 2023-09-03 VITALS — BP 104/76 | HR 66 | Ht 73.0 in | Wt 246.0 lb

## 2023-09-03 DIAGNOSIS — E781 Pure hyperglyceridemia: Secondary | ICD-10-CM

## 2023-09-03 DIAGNOSIS — E785 Hyperlipidemia, unspecified: Secondary | ICD-10-CM | POA: Diagnosis not present

## 2023-09-03 NOTE — Patient Instructions (Signed)

## 2023-09-03 NOTE — Progress Notes (Signed)
 Date:  09/03/2023   Name:  James Bishop   DOB:  [birth year]   MRN:  [id number]   Chief Complaint: Diabetes  Diabetes He presents for his initial diabetic visit. Diabetes type: hyperglycemia x 1. His disease course has been stable. There are no hypoglycemic associated symptoms. Pertinent negatives for hypoglycemia include no headaches, hunger or pallor. There are no diabetic associated symptoms. Pertinent negatives for diabetes include no blurred vision, no chest pain, no fatigue, no foot paresthesias, no foot ulcerations, no polydipsia, no polyphagia, no polyuria, no visual change, no weakness and no weight loss. There are no hypoglycemic complications. Symptoms are stable. There are no diabetic complications. Pertinent negatives for diabetic complications include no CVA, heart disease, impotence, peripheral neuropathy, PVD or retinopathy. Risk factors for coronary artery disease include dyslipidemia and hypertension. Current diabetic treatment includes diet.  Hyperlipidemia This is a chronic problem. The current episode started more than 1 year ago. Recent lipid tests were reviewed and are normal. He has no history of diabetes. Pertinent negatives include no chest pain.    Lab Results  Component Value Date   NA 141 10/17/2022   K 4.2 10/17/2022   CO2 24 10/17/2022   GLUCOSE 117 (H) 10/17/2022   BUN 20 10/17/2022   CREATININE 0.97 10/17/2022   CALCIUM  9.3 10/17/2022   EGFR 89 10/17/2022   GFRNONAA 93 05/21/2020   Lab Results  Component Value Date   CHOL 175 10/17/2022   HDL 35 (L) 10/17/2022   LDLCALC 83 10/17/2022   TRIG 349 (H) 10/17/2022   CHOLHDL 4.8 08/18/2016   No results found for: "TSH" No results found for: "HGBA1C" No results found for: "WBC", "HGB", "HCT", "MCV", "PLT" Lab Results  Component Value Date   ALT 15 11/21/2020   AST 14 11/21/2020   ALKPHOS 91 11/21/2020   BILITOT 0.7 11/21/2020   No results found for: "25OHVITD2", "25OHVITD3", "VD25OH"    Review of Systems  Constitutional:  Negative for fatigue and weight loss.  Eyes:  Negative for blurred vision.  Cardiovascular:  Negative for chest pain.  Endocrine: Negative for polydipsia, polyphagia and polyuria.  Genitourinary:  Negative for impotence.  Skin:  Negative for pallor.  Neurological:  Negative for weakness and headaches.    Patient Active Problem List   Diagnosis Date Noted   Polyp of sigmoid colon    Class 2 severe obesity due to excess calories with serious comorbidity and body mass index (BMI) of 38.0 to 38.9 in adult [facility]) 09/21/2017   Recurrent major depressive disorder, in partial remission (HCC) 08/18/2016   Encounter for screening colonoscopy 09/27/2014   Anxiety and depression 09/27/2014   Dyslipidemia 09/27/2014   Hypertension 09/27/2014    Allergies  Allergen Reactions   Bee Venom    Penicillins Rash and Other (See Comments)    fever    Past Surgical History:  Procedure Laterality Date   COLONOSCOPY WITH PROPOFOL  N/A 10/01/2017   Procedure: COLONOSCOPY WITH PROPOFOL ;  Surgeon: Marnee Sink, MD;  Location: [facility];  Service: Endoscopy;  Laterality: N/A;   POLYPECTOMY  10/01/2017   Procedure: POLYPECTOMY;  Surgeon: Marnee Sink, MD;  Location: [facility];  Service: Endoscopy;;    Social History   Tobacco Use   Smoking status: Never   Smokeless tobacco: Never  Substance Use Topics   Alcohol use: Not Currently    Alcohol/week: 0.0 standard drinks of alcohol   Drug use: No     Medication list has been reviewed  and updated.  Current Meds  Medication Sig   atorvastatin  (LIPITOR) 10 MG tablet TAKE ONE TABLET BY MOUTH ONCE DAILY   escitalopram  (LEXAPRO ) 20 MG tablet TAKE ONE TABLET BY MOUTH ONCE DAILY   hydrochlorothiazide  (HYDRODIURIL ) 25 MG tablet TAKE ONE TABLET BY MOUTH ONCE DAILY   ibuprofen  (ADVIL ) 800 MG tablet TAKE 1 TABLET BY MOUTH EVERY 8 HOURS AS NEEDED   lisinopril  (ZESTRIL ) 30 MG tablet TAKE ONE TABLET BY  MOUTH ONCE DAILY   Omega-3 Fatty Acids (FISH OIL ) 500 MG CAPS Take 1 capsule (500 mg total) by mouth daily.       09/03/2023    8:29 AM 10/17/2022    4:06 PM 02/19/2022   10:21 AM 05/24/2021    8:54 AM  GAD 7 : Generalized Anxiety Score  Nervous, Anxious, on Edge 0 0 0 0  Control/stop worrying 0 0 0 0  Worry too much - different things 0 0 0 0  Trouble relaxing 0 0 0 0  Restless 0 0 0 0  Easily annoyed or irritable 0 0 0 1  Afraid - awful might happen 0 0 0 0  Total GAD 7 Score 0 0 0 1  Anxiety Difficulty Not difficult at all Not difficult at all Not difficult at all Not difficult at all       09/03/2023    8:29 AM 10/17/2022    4:06 PM 02/19/2022   10:21 AM  Depression screen PHQ 2/9  Decreased Interest 0 0 0  Down, Depressed, Hopeless 0 0 0  PHQ - 2 Score 0 0 0  Altered sleeping 0 0 0  Tired, decreased energy 2 0 0  Change in appetite 0 0 0  Feeling bad or failure about yourself  0 0 0  Trouble concentrating 0 0 0  Moving slowly or fidgety/restless 0 0 0  Suicidal thoughts 0 0 0  PHQ-9 Score 2 0 0  Difficult doing work/chores Not difficult at all Not difficult at all Not difficult at all    BP Readings from Last 3 Encounters:  09/03/23 104/76  10/17/22 120/70  02/19/22 (!) 134/90    Physical Exam Vitals and nursing note reviewed.  Constitutional:      Appearance: He is well-developed.  HENT:     Head: Normocephalic and atraumatic.     Right Ear: Tympanic membrane, ear canal and external ear normal.     Left Ear: Tympanic membrane, ear canal and external ear normal.     Nose: Nose normal.     Mouth/Throat:     Dentition: Normal dentition.  Eyes:     General: Lids are normal. No scleral icterus.    Conjunctiva/sclera: Conjunctivae normal.     Pupils: Pupils are equal, round, and reactive to light.  Neck:     Thyroid: No thyromegaly.     Vascular: No carotid bruit, hepatojugular reflux or JVD.     Trachea: No tracheal deviation.  Cardiovascular:     Rate and  Rhythm: Normal rate and regular rhythm.     Heart sounds: Normal heart sounds.  Pulmonary:     Effort: Pulmonary effort is normal.     Breath sounds: Normal breath sounds.  Abdominal:     General: Bowel sounds are normal.     Palpations: Abdomen is soft. There is no hepatomegaly, splenomegaly or mass.     Tenderness: There is no abdominal tenderness. There is no guarding or rebound.     Hernia: There is no hernia in  the left inguinal area.  Musculoskeletal:        General: Normal range of motion.     Cervical back: Normal range of motion and neck supple.  Lymphadenopathy:     Cervical: No cervical adenopathy.  Skin:    General: Skin is warm and dry.     Findings: No rash.  Neurological:     Mental Status: He is alert and oriented to person, place, and time.     Sensory: No sensory deficit.     Deep Tendon Reflexes: Reflexes are normal and symmetric.  Psychiatric:        Mood and Affect: Mood is not anxious or depressed.     Wt Readings from Last 3 Encounters:  09/03/23 246 lb (111.6 kg)  10/17/22 287 lb (130.2 kg)  02/19/22 282 lb (127.9 kg)    BP 104/76   Pulse 66   Ht 6\' 1"  (1.854 m)   Wt 246 lb (111.6 kg)   SpO2 98%   BMI 32.46 kg/m   Assessment and Plan: 1. Dyslipidemia (Primary) Chronic.  Controlled.  Stable.  Elevated triglycerides on 2 occasions 2-300 range.  But has been controlled the past 2 times on current dosing of atorvastatin  and omega-3 fatty acids.  The concern that I would have met this may reflect hyperglycemia well so we will get a CMP for hepatic concerns check lipid panel for current triglyceride cholesterol evaluation.  Patient is also been reemphasized with low-cholesterol low triglyceride guidelines as well - Comprehensive metabolic panel with GFR - Lipid Panel With LDL/HDL Ratio  2. Hyperglyceridemia New onset.  Uncontrolled.  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 - Comprehensive metabolic panel with GFR - Hemoglobin A1c  3. Hypertriglyceridemia As noted above patient's had elevated triglycerides and is currently on omega-3 we will repeat lipid panel and adjust accordingly. - Lipid Panel With LDL/HDL Ratio     Alayne Allis, MD

## 2023-09-04 LAB — COMPREHENSIVE METABOLIC PANEL WITH GFR
ALT: 19 IU/L (ref 0–44)
AST: 14 IU/L (ref 0–40)
Albumin: 4.4 g/dL (ref 3.9–4.9)
Alkaline Phosphatase: 142 IU/L — ABNORMAL HIGH (ref 44–121)
BUN/Creatinine Ratio: 20 (ref 10–24)
BUN: 17 mg/dL (ref 8–27)
Bilirubin Total: 1.2 mg/dL (ref 0.0–1.2)
CO2: 18 mmol/L — ABNORMAL LOW (ref 20–29)
Calcium: 9.8 mg/dL (ref 8.6–10.2)
Chloride: 100 mmol/L (ref 96–106)
Creatinine, Ser: 0.83 mg/dL (ref 0.76–1.27)
Globulin, Total: 2.4 g/dL (ref 1.5–4.5)
Glucose: 352 mg/dL — ABNORMAL HIGH (ref 70–99)
Potassium: 4.4 mmol/L (ref 3.5–5.2)
Sodium: 137 mmol/L (ref 134–144)
Total Protein: 6.8 g/dL (ref 6.0–8.5)
eGFR: 100 mL/min/{1.73_m2} (ref >59)

## 2023-09-04 LAB — LIPID PANEL WITH LDL/HDL RATIO
Cholesterol, Total: 173 mg/dL (ref 100–199)
HDL: 37 mg/dL — ABNORMAL LOW (ref >39)
LDL Chol Calc (NIH): 100 mg/dL — ABNORMAL HIGH (ref 0–99)
LDL/HDL Ratio: 2.7 ratio (ref 0.0–3.6)
Triglycerides: 210 mg/dL — ABNORMAL HIGH (ref 0–149)
VLDL Cholesterol Cal: 36 mg/dL (ref 5–40)

## 2023-09-04 LAB — HEMOGLOBIN A1C
Est. average glucose Bld gHb Est-mCnc: 369 mg/dL
Hgb A1c MFr Bld: 14.5 % — ABNORMAL HIGH (ref 4.8–5.6)

## 2023-09-07 ENCOUNTER — Other Ambulatory Visit: Payer: Self-pay | Admitting: Family Medicine

## 2023-09-07 ENCOUNTER — Ambulatory Visit: Admitting: Family Medicine

## 2023-09-07 ENCOUNTER — Encounter: Payer: Self-pay | Admitting: Family Medicine

## 2023-09-07 VITALS — BP 102/70 | HR 71 | Ht 73.0 in | Wt 246.0 lb

## 2023-09-07 DIAGNOSIS — Z7984 Long term (current) use of oral hypoglycemic drugs: Secondary | ICD-10-CM | POA: Diagnosis not present

## 2023-09-07 DIAGNOSIS — H1033 Unspecified acute conjunctivitis, bilateral: Secondary | ICD-10-CM | POA: Diagnosis not present

## 2023-09-07 DIAGNOSIS — E0869 Diabetes mellitus due to underlying condition with other specified complication: Secondary | ICD-10-CM

## 2023-09-07 DIAGNOSIS — E781 Pure hyperglyceridemia: Secondary | ICD-10-CM

## 2023-09-07 DIAGNOSIS — E1165 Type 2 diabetes mellitus with hyperglycemia: Secondary | ICD-10-CM | POA: Diagnosis not present

## 2023-09-07 MED ORDER — INSULIN GLARGINE 100 UNIT/ML ~~LOC~~ SOLN: 10.0000 [IU] | Freq: Every day | SUBCUTANEOUS | 11 refills | Status: AC

## 2023-09-07 MED ORDER — METFORMIN HCL 500 MG PO TABS: 500.0000 mg | ORAL_TABLET | Freq: Two times a day (BID) | ORAL | 0 refills | Status: DC

## 2023-09-07 MED ORDER — SULFACETAMIDE SODIUM 10 % OP SOLN
1.0000 [drp] | OPHTHALMIC | 0 refills | Status: AC
Start: 2023-09-07 — End: ?

## 2023-09-07 NOTE — Patient Instructions (Signed)
 Insulin  Injection Instructions, Single Insulin  Dose, Adult There are many different types of insulin . The type of insulin  that you take may determine how many injections you give yourself and when you need to give the injections. Supplies needed: Soap and water . Insulin  medicine in small bottles (vials). A new, unused insulin  syringe. Alcohol wipes. A disposal container for sharp items (sharps container), such as an empty plastic bottle with a cover. How to choose a site for injection The body absorbs insulin  differently, depending on where the insulin  is injected (injection site). It is best to inject insulin  into the same body area each time; for example, always in the abdomen. However, you should use a different spot in that area for each injection. Do not inject the insulin  in the same spot each time. There are five main areas that can be used for injecting. These areas are: Abdomen. This is the preferred area. Front of thigh. Upper, outer side of thigh. Upper, outer side of arm. Upper, outer part of buttock. How to give a single-dose insulin  injection Get ready Wash your hands with soap and water  for at least 20 seconds. If soap and water  are not available, use hand sanitizer. Test your blood sugar (glucose) level and write down that number. Follow any instructions from your health care provider about what to do if your blood glucose level is higher or lower than your normal range. Make sure the vial you are using has the right kind of insulin  and there is enough insulin  for your dose. Check the expiration date. Check to make sure you have the correct type of insulin  syringe for the concentration of insulin  that you are using. Use a new, unused insulin  syringe each time you need to inject insulin . If you are using CLEAR insulin , check to see that it is clear and free of clumps. If you are using CLOUDY insulin , mix it by gently rolling the insulin  vial between your palms several times. Do  not shake the vial. Remove the plastic pop-top covering from the vial of insulin . This type of covering is present on a vial when it is new. Use an alcohol wipe to clean the rubber top of the vial. Remove the plastic cover from the syringe needle. Do not let the needle touch anything. Push air into the vial To bring (draw up) air into the syringe, slowly pull back on the syringe plunger. Stop pulling the plunger when the dose indicator gets to the number of units that you will be using. While you keep the vial right-side-up, poke the needle through the rubber top of the vial. Do not turn the vial upside down to do this. Push the plunger all the way into the syringe. Doing that will push air into the vial. Do not take the needle out of the vial yet. Fill the syringe  While the needle is still in the vial, turn the vial upside down and hold it at eye level. Slowly pull back on the plunger. Stop pulling the plunger when the dose indicator gets to the desired number of units. If you see air bubbles in the syringe, slowly move the plunger up and down 2 or 3 times to make them go away. If you had to move the plunger to get rid of air bubbles, pull back the plunger until the dose indicator returns to the correct dose. Remove the needle from the vial. Do not let the needle touch anything. Inject the insulin   Use an alcohol wipe to  clean the site where you will be inserting the needle. Let the site air-dry. Hold the syringe in your writing hand like a pencil. If directed by your health care provider, use your other hand to pinch and hold about an inch (2.5 cm) of skin at the injection site. Do not directly touch the cleaned part of the skin. Gently but quickly, put the needle straight into the skin. The needle should be at a 45-degree angle or a 90-degree angle (perpendicular) to the skin, as told by your health care provider. Push the needle in as far as it will go (to the hub). When the needle is  completely inserted into the skin, let go of the skin that you are pinching. Continue to hold the syringe in place with your writing hand. Use your thumb or index finger of your writing hand to push the plunger all the way into the syringe to inject the insulin . Wait 5-10 seconds, then pull the needle straight out of the skin. This will allow all of the insulin  to go from the syringe and needle into your body. If bleeding occurs, press and hold gauze over the injection site until any bleeding stops. Do not rub the area. Do not put the plastic cover back on the needle. Discard the syringe and needle directly into a sharps container. How to throw away supplies Discard all used needles in a sharps container. Follow the disposal regulations for the area where you live. Do not use any syringe or needle more than one time. Throw away empty vials in the regular trash. Questions to ask your health care provider How often should I be taking insulin ? How often should I check my blood glucose? What amount of insulin  should I be taking at each time? What are the side effects? What should I do if my blood glucose is too high? What should I do if my blood glucose is too low? What should I do if I forget to take my insulin ? What number should I call if I have questions? Where to find more information American Diabetes Association (ADA): diabetes.org Association of Diabetes Care and Education Specialists (ADCES): diabeteseducator.org Summary Before you give yourself an insulin  injection, be sure to wash your hands for at least 20 seconds and test your blood glucose level. Write down that number. Check the expiration date and the type of insulin  that you are using. The type of insulin  that you take may determine how many injections you give yourself and when you need to give the injections. It is best to inject insulin  into the same body area each time; for example, always in the abdomen. However, you should  use a different spot in that area for each injection. Do not use an insulin  syringe more than one time. This information is not intended to replace advice given to you by your health care provider. Make sure you discuss any questions you have with your health care provider. Document Revised: 02/15/2023 Document Reviewed: 02/15/2023 Elsevier Patient Education  2024 [employer].

## 2023-09-07 NOTE — Progress Notes (Signed)
 Date:  09/07/2023   Name:  James Bishop   DOB:  12-08-1960   MRN:  [id number]   Chief Complaint: Diabetes and Conjunctivitis (Bil eye conjunctivitis.)  Diabetes He presents for his follow-up diabetic visit. 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 There are no hypoglycemic associated symptoms. Associated symptoms include weight loss. Pertinent negatives for diabetes include no blurred vision, no chest pain, no fatigue, no foot paresthesias, no foot ulcerations, no polydipsia, no polyphagia, no polyuria, no visual change and no weakness. (35 lbs since Valentine Days) There are no hypoglycemic complications. Symptoms are worsening. There are no diabetic complications. Risk factors for coronary artery disease include diabetes mellitus, dyslipidemia, male sex and hypertension. When asked about current treatments, none were reported. He is following a generally healthy diet. Meal planning includes avoidance of concentrated sweets and carbohydrate counting. He participates in exercise intermittently. An ACE inhibitor/angiotensin II receptor blocker is being taken.  Conjunctivitis  The current episode started more than 1 week ago. The onset was sudden. The problem has been unchanged. The problem is moderate. Associated symptoms include eye itching, eye discharge and eye redness. Pertinent negatives include no fever, no decreased vision, no abdominal pain and no wheezing.    Lab Results  Component Value Date   NA 137 09/03/2023   K 4.4 09/03/2023   CO2 18 (L) 09/03/2023   GLUCOSE 352 (H) 09/03/2023   BUN 17 09/03/2023   CREATININE 0.83 09/03/2023   CALCIUM  9.8 09/03/2023   EGFR 100 09/03/2023   GFRNONAA 93 05/21/2020   Lab Results  Component Value Date   CHOL 173 09/03/2023   HDL 37 (L) 09/03/2023   LDLCALC 100 (H) 09/03/2023   TRIG 210 (H) 09/03/2023   CHOLHDL 4.8 08/18/2016   No results found for: "TSH" Lab Results  Component Value Date   HGBA1C 14.5 (H) 09/03/2023   No  results found for: "WBC", "HGB", "HCT", "MCV", "PLT" Lab Results  Component Value Date   ALT 19 09/03/2023   AST 14 09/03/2023   ALKPHOS 142 (H) 09/03/2023   BILITOT 1.2 09/03/2023   No results found for: "25OHVITD2", "25OHVITD3", "VD25OH"   Review of Systems  Constitutional:  Positive for weight loss. Negative for fatigue and fever.  Eyes:  Positive for discharge, redness and itching. Negative for blurred vision.  Respiratory:  Negative for choking, chest tightness, shortness of breath and wheezing.   Cardiovascular:  Negative for chest pain and palpitations.  Gastrointestinal:  Negative for abdominal distention and abdominal pain.  Endocrine: Negative for polydipsia, polyphagia and polyuria.  Genitourinary:  Negative for decreased urine volume, penile swelling and urgency.  Neurological:  Negative for weakness.    Patient Active Problem List   Diagnosis Date Noted   Polyp of sigmoid colon    Class 2 severe obesity due to excess calories with serious comorbidity and body mass index (BMI) of 38.0 to 38.9 in adult [facility]) 09/21/2017   Recurrent major depressive disorder, in partial remission (HCC) 08/18/2016   Encounter for screening colonoscopy 09/27/2014   Anxiety and depression 09/27/2014   Dyslipidemia 09/27/2014   Hypertension 09/27/2014    Allergies  Allergen Reactions   Bee Venom    Penicillins Rash and Other (See Comments)    fever    Past Surgical History:  Procedure Laterality Date   COLONOSCOPY WITH PROPOFOL  N/A 10/01/2017   Procedure: COLONOSCOPY WITH PROPOFOL ;  Surgeon: Marnee Sink, MD;  Location: [facility];  Service: Endoscopy;  Laterality: N/A;  POLYPECTOMY  10/01/2017   Procedure: POLYPECTOMY;  Surgeon: Marnee Sink, MD;  [facility];  Service: Endoscopy;;    Social History   Tobacco Use   Smoking status: Never   Smokeless tobacco: Never  Substance Use Topics   Alcohol use: Not Currently    Alcohol/week: 0.0 standard  drinks of alcohol   Drug use: No     Medication list has been reviewed and updated.  Current Meds  Medication Sig   atorvastatin  (LIPITOR) 10 MG tablet TAKE ONE TABLET BY MOUTH ONCE DAILY   escitalopram  (LEXAPRO ) 20 MG tablet TAKE ONE TABLET BY MOUTH ONCE DAILY   hydrochlorothiazide  (HYDRODIURIL ) 25 MG tablet TAKE ONE TABLET BY MOUTH ONCE DAILY   lisinopril  (ZESTRIL ) 30 MG tablet TAKE ONE TABLET BY MOUTH ONCE DAILY   Omega-3 Fatty Acids (FISH OIL ) 500 MG CAPS Take 1 capsule (500 mg total) by mouth daily.       09/03/2023    8:29 AM 10/17/2022    4:06 PM 02/19/2022   10:21 AM 05/24/2021    8:54 AM  GAD 7 : Generalized Anxiety Score  Nervous, Anxious, on Edge 0 0 0 0  Control/stop worrying 0 0 0 0  Worry too much - different things 0 0 0 0  Trouble relaxing 0 0 0 0  Restless 0 0 0 0  Easily annoyed or irritable 0 0 0 1  Afraid - awful might happen 0 0 0 0  Total GAD 7 Score 0 0 0 1  Anxiety Difficulty Not difficult at all Not difficult at all Not difficult at all Not difficult at all       09/03/2023    8:29 AM 10/17/2022    4:06 PM 02/19/2022   10:21 AM  Depression screen PHQ 2/9  Decreased Interest 0 0 0  Down, Depressed, Hopeless 0 0 0  PHQ - 2 Score 0 0 0  Altered sleeping 0 0 0  Tired, decreased energy 2 0 0  Change in appetite 0 0 0  Feeling bad or failure about yourself  0 0 0  Trouble concentrating 0 0 0  Moving slowly or fidgety/restless 0 0 0  Suicidal thoughts 0 0 0  PHQ-9 Score 2 0 0  Difficult doing work/chores Not difficult at all Not difficult at all Not difficult at all    BP Readings from Last 3 Encounters:  09/07/23 102/70  09/03/23 104/76  10/17/22 120/70    Physical Exam HENT:     Right Ear: Tympanic membrane and ear canal normal.     Left Ear: Tympanic membrane normal.     Nose: Nose normal.  Eyes:     General: Lids are normal.     Conjunctiva/sclera:     Right eye: Right conjunctiva is injected. Exudate present.     Left eye: Left  conjunctiva is injected. Exudate present.     Pupils: Pupils are equal, round, and reactive to light.  Cardiovascular:     Heart sounds: S1 normal and S2 normal.     No systolic murmur is present.     No diastolic murmur is present.     No S3 or S4 sounds.  Pulmonary:     Breath sounds: No decreased breath sounds, wheezing, rhonchi or rales.     Wt Readings from Last 3 Encounters:  09/07/23 246 lb (111.6 kg)  09/03/23 246 lb (111.6 kg)  10/17/22 287 lb (130.2 kg)    BP 102/70   Pulse 71  Ht 6\' 1"  (1.854 m)   Wt 246 lb (111.6 kg)   SpO2 98%   BMI 32.46 kg/m   Assessment and Plan: 1. Hyperglycemia due to diabetes mellitus (HCC) (Primary) New onset.  Patient had up until most recently had normal fasting glucoses in the 115-130 range.  There has been no symptomatology of polyuria polydipsia but because of some weight gain it was discussed that patient needed to be more careful with his concentrated sweets around Valentine's Day and after that patient states that he had an accelerated loss of void.  I do not know if this is when the issue of the hyperglycemia started to reemerge but it has been very rapid and very remarkable in that recent fasting glucoses are over 300 and A1c is greater than 14.  This may represent a lata concern as noted below and we will proceed with further evaluation. - Diabetes Autoimmune Profile - C-reactive protein  2. Diabetes due to underlying condition w oth complication (HCC) New onset.  Patient has had recent and rather rapid transition from high normal to his slightly elevated fasting glucoses to a significant level of 300 mg.  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  In the meantime I wish to initiate Lantus  10 units daily and metformin  500 mg twice a day.  Reviewed with patient and patient  understands and we have made also referral to endocrinology and this has been discussed with Dr. Shelvy Dickens.  New onset.  Persistent.  Stable. - Diabetes Autoimmune Profile - C-reactive protein - insulin  glargine (LANTUS ) 100 UNIT/ML injection; Inject 0.1 mLs (10 Units total) into the skin daily.  Dispense: 10 mL; Refill: 11 - metFORMIN  (GLUCOPHAGE ) 500 MG tablet; Take 1 tablet (500 mg total) by mouth 2 (two) times daily with a meal.  Dispense: 60 tablet; Refill: 0 - Basic metabolic panel with GFR  3. Acute bacterial conjunctivitis of both eyes New onset.  Persistent.  Stable.  Patient has new onset of 48 hours of conjunctival erythema which is consistent with bacterial conjunctivitis and we will treat with sodium solution at 10% ophthalmic drops every 3 hours. - sulfacetamide  (BLEPH -10) 10 % ophthalmic solution; Place 1 drop into both eyes every 3 (three) hours.  Dispense: 15 mL; Refill: 0     Alayne Allis, MD

## 2023-09-16 ENCOUNTER — Other Ambulatory Visit: Payer: Self-pay | Admitting: Family Medicine

## 2023-09-16 LAB — DIABETES AUTOIMMUNE PROFILE
Anti GAD 65 Antibodies: <5 U/mL
IA-2 Autoantibodies: <7.5 U/mL
Insulin AutoAb: <5 uU/mL
ZNT8 Antibodies: <15 U/mL

## 2023-09-16 LAB — BASIC METABOLIC PANEL WITH GFR
BUN/Creatinine Ratio: 23 (ref 10–24)
BUN: 18 mg/dL (ref 8–27)
CO2: 21 mmol/L (ref 20–29)
Calcium: 9.3 mg/dL (ref 8.6–10.2)
Chloride: 95 mmol/L — ABNORMAL LOW (ref 96–106)
Creatinine, Ser: 0.79 mg/dL (ref 0.76–1.27)
Glucose: 464 mg/dL — ABNORMAL HIGH (ref 70–99)
Potassium: 4.6 mmol/L (ref 3.5–5.2)
Sodium: 135 mmol/L (ref 134–144)
eGFR: 101 mL/min/{1.73_m2} (ref >59)

## 2023-09-16 LAB — C-REACTIVE PROTEIN: CRP: 14 mg/L — ABNORMAL HIGH (ref 0–10)

## 2023-10-19 ENCOUNTER — Telehealth: Payer: Self-pay

## 2023-10-19 ENCOUNTER — Other Ambulatory Visit: Payer: Self-pay

## 2023-10-19 DIAGNOSIS — F419 Anxiety disorder, unspecified: Secondary | ICD-10-CM

## 2023-10-19 DIAGNOSIS — E785 Hyperlipidemia, unspecified: Secondary | ICD-10-CM

## 2023-10-19 DIAGNOSIS — I10 Essential (primary) hypertension: Secondary | ICD-10-CM

## 2023-10-19 DIAGNOSIS — E0869 Diabetes mellitus due to underlying condition with other specified complication: Secondary | ICD-10-CM

## 2023-10-19 MED ORDER — LISINOPRIL 30 MG PO TABS: 30.0000 mg | ORAL_TABLET | Freq: Every day | ORAL | 0 refills | Status: DC

## 2023-10-19 MED ORDER — HYDROCHLOROTHIAZIDE 25 MG PO TABS: 25.0000 mg | ORAL_TABLET | Freq: Every day | ORAL | 0 refills | Status: DC

## 2023-10-19 MED ORDER — METFORMIN HCL 500 MG PO TABS: 500.0000 mg | ORAL_TABLET | Freq: Two times a day (BID) | ORAL | 0 refills | Status: DC

## 2023-10-19 MED ORDER — ATORVASTATIN CALCIUM 10 MG PO TABS: 10.0000 mg | ORAL_TABLET | Freq: Every day | ORAL | 0 refills | Status: DC

## 2023-10-19 MED ORDER — ESCITALOPRAM OXALATE 20 MG PO TABS: 20.0000 mg | ORAL_TABLET | Freq: Every day | ORAL | 0 refills | Status: DC

## 2023-10-19 NOTE — Telephone Encounter (Signed)
 Patient is out of town will try to schedule through mychart if not he can call back.

## 2023-10-19 NOTE — Telephone Encounter (Signed)
 Copied from CRM [id number]. Topic: Clinical - Medication Question >> Oct 19, 2023  4:10 PM James Bishop wrote: Reason for CRM: patient calling to asking who could fill his 4 refills with Dr. Joshua retiring and he states the pharmacy sent a request and he states there was no medication to pick up today  Pt num [phone] [facility]) [facility] - [place], [place] - [address] [place] [address] [place] [address] Phone: [phone] Fax: [phone]

## 2023-10-19 NOTE — Telephone Encounter (Signed)
 Patient needs an appt with a new provider in the next 30 days. I refilled his meds for 30 days. Please call to schedule appt.  Thank you!

## 2023-11-02 ENCOUNTER — Encounter: Admitting: Family Medicine

## 2023-11-16 ENCOUNTER — Other Ambulatory Visit: Payer: Self-pay | Admitting: Family Medicine

## 2023-11-16 DIAGNOSIS — E0869 Diabetes mellitus due to underlying condition with other specified complication: Secondary | ICD-10-CM

## 2023-11-16 DIAGNOSIS — E785 Hyperlipidemia, unspecified: Secondary | ICD-10-CM

## 2023-11-16 DIAGNOSIS — I10 Essential (primary) hypertension: Secondary | ICD-10-CM

## 2023-11-16 DIAGNOSIS — F32A Depression, unspecified: Secondary | ICD-10-CM

## 2023-11-17 NOTE — Telephone Encounter (Signed)
 Requested Prescriptions  Pending Prescriptions Disp Refills   metFORMIN  (GLUCOPHAGE ) 500 MG tablet [Pharmacy Med Name: metformin  500 mg tablet] 60 tablet     Sig: Take 1 tablet by mouth twice daily WITH FOOD     Endocrinology:  Diabetes - Biguanides Failed - 11/17/2023 12:42 PM      Failed - HBA1C is between 0 and 7.9 and within 180 days    Hgb A1c MFr Bld  Date Value Ref Range Status  09/03/2023 14.5 (H) 4.8 - 5.6 % Final    Comment:             Prediabetes: 5.7 - 6.4          Diabetes: >6.4          Glycemic control for adults with diabetes: <7.0          Failed - B12 Level in normal range and within 720 days    No results found for: VITAMINB12       Failed - CBC within normal limits and completed in the last 12 months    No results found for: WBC, WBCKUC No results found for: RBC, RBCKUC No results found for: HGB, HGBKUC, HGBPOCKUC, HGBOTHER, TOTHGB, HGBPLASMA, LABHEMOF No results found for: HCT, HCTKUC, SRHCT No results found for: MCHC, MCHCKUC No results found for: MCH, MCHKUC No results found for: MCVKUC, MCV No results found for: PLTCOUNTKUC, LABPLAT, POCPLA No results found for: RDW, RDWKUC, POCRDW       Passed - Cr in normal range and within 360 days    Creatinine, Ser  Date Value Ref Range Status  09/07/2023 0.79 0.76 - 1.27 mg/dL Final         Passed - eGFR in normal range and within 360 days    GFR calc Af Amer  Date Value Ref Range Status  05/21/2020 107 >59 mL/min/1.73 Final    Comment:    **In accordance with recommendations from the NKF-ASN Task force,**   [facility] is in the process of updating its eGFR calculation to the   2021 CKD-EPI creatinine equation that estimates kidney function   without a race variable.    GFR calc non Af Amer  Date Value Ref Range Status  05/21/2020 93 >59 mL/min/1.73 Final   eGFR  Date Value Ref Range Status  09/07/2023 101 >59 mL/min/1.73 Final         Passed -  Valid encounter within last 6 months    Recent Outpatient Visits           2 months ago Hyperglycemia due to diabetes mellitus (HCC)   [place] [facility], MD   2 months ago Dyslipidemia   [place] [facility], MD               hydrochlorothiazide  (HYDRODIURIL ) 25 MG tablet [Pharmacy Med Name: hydrochlorothiazide  25 mg tablet] 90 tablet 0    Sig: TAKE ONE TABLET BY MOUTH ONCE DAILY     Cardiovascular: Diuretics - Thiazide Passed - 11/17/2023 12:42 PM      Passed - Cr in normal range and within 180 days    Creatinine, Ser  Date Value Ref Range Status  09/07/2023 0.79 0.76 - 1.27 mg/dL Final         Passed - K in normal range and within 180 days    Potassium  Date Value Ref Range Status  09/07/2023 4.6  3.5 - 5.2 mmol/L Final         Passed - Na in normal range and within 180 days    Sodium  Date Value Ref Range Status  09/07/2023 135 134 - 144 mmol/L Final         Passed - Last BP in normal range    BP Readings from Last 1 Encounters:  09/07/23 102/70         Passed - Valid encounter within last 6 months    Recent Outpatient Visits           2 months ago Hyperglycemia due to diabetes mellitus (HCC)   [place] [facility], MD   2 months ago Dyslipidemia   [place] [facility], MD              Signed Prescriptions Disp Refills   lisinopril  (ZESTRIL ) 30 MG tablet 90 tablet 0    Sig: TAKE ONE TABLET BY MOUTH ONCE DAILY     Cardiovascular:  ACE Inhibitors Passed - 11/17/2023 12:42 PM      Passed - Cr in normal range and within 180 days    Creatinine, Ser  Date Value Ref Range Status  09/07/2023 0.79 0.76 - 1.27 mg/dL Final         Passed - K in normal range and within 180 days    Potassium  Date Value Ref Range Status   09/07/2023 4.6 3.5 - 5.2 mmol/L Final         Passed - Patient is not pregnant      Passed - Last BP in normal range    BP Readings from Last 1 Encounters:  09/07/23 102/70         Passed - Valid encounter within last 6 months    Recent Outpatient Visits           2 months ago Hyperglycemia due to diabetes mellitus (HCC)   [place] [facility], MD   2 months ago Dyslipidemia   [place] [facility] at [facility], MD               atorvastatin  (LIPITOR) 10 MG tablet 90 tablet 0    Sig: TAKE ONE TABLET BY MOUTH ONCE DAILY     Cardiovascular:  Antilipid - Statins Failed - 11/17/2023 12:42 PM      Failed - Lipid Panel in normal range within the last 12 months    Cholesterol, Total  Date Value Ref Range Status  09/03/2023 173 100 - 199 mg/dL Final   LDL Chol Calc (NIH)  Date Value Ref Range Status  09/03/2023 100 (H) 0 - 99 mg/dL Final   HDL  Date Value Ref Range Status  09/03/2023 37 (L) >39 mg/dL Final   Triglycerides  Date Value Ref Range Status  09/03/2023 210 (H) 0 - 149 mg/dL Final         Passed - Patient is not pregnant      Passed - Valid encounter within last 12 months    Recent Outpatient Visits           2 months ago Hyperglycemia due to diabetes mellitus (HCC)   [place] [facility], MD   2 months ago Dyslipidemia  [facility] at [facility], MD               escitalopram  (LEXAPRO ) 20 MG tablet 90 tablet 0    Sig: TAKE ONE TABLET BY MOUTH ONCE DAILY     Psychiatry:  Antidepressants - SSRI Passed - 11/17/2023 12:42 PM      Passed - Completed PHQ-2 or PHQ-9 in the last 360 days      Passed - Valid encounter within last 6 months    Recent Outpatient Visits           2 months ago Hyperglycemia due to diabetes mellitus (HCC)    [place] [facility], MD   2 months ago Dyslipidemia   [facility], MD

## 2023-11-17 NOTE — Telephone Encounter (Signed)
 Requested Prescriptions  Pending Prescriptions Disp Refills   lisinopril  (ZESTRIL ) 30 MG tablet [Pharmacy Med Name: lisinopril  30 mg tablet] 90 tablet 0    Sig: TAKE ONE TABLET BY MOUTH ONCE DAILY     Cardiovascular:  ACE Inhibitors Passed - 11/17/2023 12:40 PM      Passed - Cr in normal range and within 180 days    Creatinine, Ser  Date Value Ref Range Status  09/07/2023 0.79 0.76 - 1.27 mg/dL Final         Passed - K in normal range and within 180 days    Potassium  Date Value Ref Range Status  09/07/2023 4.6 3.5 - 5.2 mmol/L Final         Passed - Patient is not pregnant      Passed - Last BP in normal range    BP Readings from Last 1 Encounters:  09/07/23 102/70         Passed - Valid encounter within last 6 months    Recent Outpatient Visits           2 months ago Hyperglycemia due to diabetes mellitus (HCC)   [place] [facility], MD   2 months ago Dyslipidemia   [place] [facility], MD               atorvastatin  (LIPITOR) 10 MG tablet [Pharmacy Med Name: atorvastatin  10 mg tablet] 90 tablet 0    Sig: TAKE ONE TABLET BY MOUTH ONCE DAILY     Cardiovascular:  Antilipid - Statins Failed - 11/17/2023 12:40 PM      Failed - Lipid Panel in normal range within the last 12 months    Cholesterol, Total  Date Value Ref Range Status  09/03/2023 173 100 - 199 mg/dL Final   LDL Chol Calc (NIH)  Date Value Ref Range Status  09/03/2023 100 (H) 0 - 99 mg/dL Final   HDL  Date Value Ref Range Status  09/03/2023 37 (L) >39 mg/dL Final   Triglycerides  Date Value Ref Range Status  09/03/2023 210 (H) 0 - 149 mg/dL Final         Passed - Patient is not pregnant      Passed - Valid encounter within last 12 months    Recent Outpatient Visits           2 months ago Hyperglycemia due to diabetes mellitus (HCC)   [place] [facility], MD   2 months ago Dyslipidemia   [facility], Deanna C, MD               escitalopram  (LEXAPRO ) 20 MG tablet [Pharmacy Med Name: escitalopram  20 mg tablet] 90 tablet 0    Sig: TAKE ONE TABLET BY MOUTH ONCE DAILY     Psychiatry:  Antidepressants - SSRI Passed - 11/17/2023 12:40 PM      Passed - Completed PHQ-2 or PHQ-9 in the last 360 days      Passed - Valid encounter within last 6 months    Recent Outpatient Visits           2 months ago Hyperglycemia due to diabetes mellitus (HCC)   [place] [facility], MD   2 months ago  Dyslipidemia   [place] [facility] at [facility], MD               metFORMIN  (GLUCOPHAGE ) 500 MG tablet [Pharmacy Med Name: metformin  500 mg tablet] 60 tablet     Sig: Take 1 tablet by mouth twice daily WITH FOOD     Endocrinology:  Diabetes - Biguanides Failed - 11/17/2023 12:40 PM      Failed - HBA1C is between 0 and 7.9 and within 180 days    Hgb A1c MFr Bld  Date Value Ref Range Status  09/03/2023 14.5 (H) 4.8 - 5.6 % Final    Comment:             Prediabetes: 5.7 - 6.4          Diabetes: >6.4          Glycemic control for adults with diabetes: <7.0          Failed - B12 Level in normal range and within 720 days    No results found for: VITAMINB12       Failed - CBC within normal limits and completed in the last 12 months    No results found for: WBC, WBCKUC No results found for: RBC, RBCKUC No results found for: HGB, HGBKUC, HGBPOCKUC, HGBOTHER, TOTHGB, HGBPLASMA, LABHEMOF No results found for: HCT, HCTKUC, SRHCT No results found for: MCHC, MCHCKUC No results found for: MCH, MCHKUC No results found for: MCVKUC, MCV No results found for: PLTCOUNTKUC, LABPLAT, POCPLA No results found  for: RDW, RDWKUC, POCRDW       Passed - Cr in normal range and within 360 days    Creatinine, Ser  Date Value Ref Range Status  09/07/2023 0.79 0.76 - 1.27 mg/dL Final         Passed - eGFR in normal range and within 360 days    GFR calc Af Amer  Date Value Ref Range Status  05/21/2020 107 >59 mL/min/1.73 Final    Comment:    **In accordance with recommendations from the NKF-ASN Task force,**   [facility] is in the process of updating its eGFR calculation to the   2021 CKD-EPI creatinine equation that estimates kidney function   without a race variable.    GFR calc non Af Amer  Date Value Ref Range Status  05/21/2020 93 >59 mL/min/1.73 Final   eGFR  Date Value Ref Range Status  09/07/2023 101 >59 mL/min/1.73 Final         Passed - Valid encounter within last 6 months    Recent Outpatient Visits           2 months ago Hyperglycemia due to diabetes mellitus (HCC)   [place] [facility], MD   2 months ago Dyslipidemia   [facility] at [facility], MD               hydrochlorothiazide  (HYDRODIURIL ) 25 MG tablet [Pharmacy Med Name: hydrochlorothiazide  25 mg tablet] 30 tablet     Sig: TAKE ONE TABLET BY MOUTH ONCE DAILY     Cardiovascular: Diuretics - Thiazide Passed - 11/17/2023 12:40 PM      Passed - Cr in normal range and within 180 days    Creatinine, Ser  Date Value Ref Range Status  09/07/2023 0.79 0.76 - 1.27 mg/dL Final         Passed -  K in normal range and within 180 days    Potassium  Date Value Ref Range Status  09/07/2023 4.6 3.5 - 5.2 mmol/L Final         Passed - Na in normal range and within 180 days    Sodium  Date Value Ref Range Status  09/07/2023 135 134 - 144 mmol/L Final         Passed - Last BP in normal range    BP Readings from Last 1 Encounters:  09/07/23 102/70         Passed - Valid encounter within last 6 months     Recent Outpatient Visits           2 months ago Hyperglycemia due to diabetes mellitus (HCC)   [place] [facility], MD   2 months ago Dyslipidemia   [facility], MD

## 2023-11-17 NOTE — Telephone Encounter (Signed)
 Requested medication (s) are due for refill today: yes  Requested medication (s) are on the active medication list: yes  Last refill:  10/19/23 #60   Future visit scheduled: no  Notes to clinic:  missing and abnormal lab work   Requested Prescriptions  Pending Prescriptions Disp Refills   metFORMIN  (GLUCOPHAGE ) 500 MG tablet [Pharmacy Med Name: metformin  500 mg tablet] 60 tablet     Sig: Take 1 tablet by mouth twice daily WITH FOOD     Endocrinology:  Diabetes - Biguanides Failed - 11/17/2023 12:42 PM      Failed - HBA1C is between 0 and 7.9 and within 180 days    Hgb A1c MFr Bld  Date Value Ref Range Status  09/03/2023 14.5 (H) 4.8 - 5.6 % Final    Comment:             Prediabetes: 5.7 - 6.4          Diabetes: >6.4          Glycemic control for adults with diabetes: <7.0          Failed - B12 Level in normal range and within 720 days    No results found for: VITAMINB12       Failed - CBC within normal limits and completed in the last 12 months    No results found for: WBC, WBCKUC No results found for: RBC, RBCKUC No results found for: HGB, HGBKUC, HGBPOCKUC, HGBOTHER, TOTHGB, HGBPLASMA, LABHEMOF No results found for: HCT, HCTKUC, SRHCT No results found for: MCHC, MCHCKUC No results found for: MCH, MCHKUC No results found for: MCVKUC, MCV No results found for: PLTCOUNTKUC, LABPLAT, POCPLA No results found for: RDW, RDWKUC, POCRDW       Passed - Cr in normal range and within 360 days    Creatinine, Ser  Date Value Ref Range Status  09/07/2023 0.79 0.76 - 1.27 mg/dL Final         Passed - eGFR in normal range and within 360 days    GFR calc Af Amer  Date Value Ref Range Status  05/21/2020 107 >59 mL/min/1.73 Final    Comment:    **In accordance with recommendations from the NKF-ASN Task force,**   [facility] is in the process of updating its eGFR calculation to the   2021 CKD-EPI creatinine equation that estimates  kidney function   without a race variable.    GFR calc non Af Amer  Date Value Ref Range Status  05/21/2020 93 >59 mL/min/1.73 Final   eGFR  Date Value Ref Range Status  09/07/2023 101 >59 mL/min/1.73 Final         Passed - Valid encounter within last 6 months    Recent Outpatient Visits           2 months ago Hyperglycemia due to diabetes mellitus (HCC)    [facility], MD   2 months ago Dyslipidemia   [facility], MD              Signed Prescriptions Disp Refills   lisinopril  (ZESTRIL ) 30 MG tablet 90 tablet 0    Sig: TAKE ONE TABLET BY MOUTH ONCE DAILY     Cardiovascular:  ACE Inhibitors Passed - 11/17/2023 12:42 PM      Passed - Cr in normal range and within 180 days    Creatinine, Ser  Date Value  Ref Range Status  09/07/2023 0.79 0.76 - 1.27 mg/dL Final         Passed - K in normal range and within 180 days    Potassium  Date Value Ref Range Status  09/07/2023 4.6 3.5 - 5.2 mmol/L Final         Passed - Patient is not pregnant      Passed - Last BP in normal range    BP Readings from Last 1 Encounters:  09/07/23 102/70         Passed - Valid encounter within last 6 months    Recent Outpatient Visits           2 months ago Hyperglycemia due to diabetes mellitus (HCC)   [place] [facility], MD   2 months ago Dyslipidemia   [place] [facility] at [facility], MD               atorvastatin  (LIPITOR) 10 MG tablet 90 tablet 0    Sig: TAKE ONE TABLET BY MOUTH ONCE DAILY     Cardiovascular:  Antilipid - Statins Failed - 11/17/2023 12:42 PM      Failed - Lipid Panel in normal range within the last 12 months    Cholesterol, Total  Date Value Ref Range Status  09/03/2023 173 100 - 199 mg/dL Final   LDL Chol Calc  (NIH)  Date Value Ref Range Status  09/03/2023 100 (H) 0 - 99 mg/dL Final   HDL  Date Value Ref Range Status  09/03/2023 37 (L) >39 mg/dL Final   Triglycerides  Date Value Ref Range Status  09/03/2023 210 (H) 0 - 149 mg/dL Final         Passed - Patient is not pregnant      Passed - Valid encounter within last 12 months    Recent Outpatient Visits           2 months ago Hyperglycemia due to diabetes mellitus (HCC)   [place] [facility], MD   2 months ago Dyslipidemia   [facility] at [facility], MD               escitalopram  (LEXAPRO ) 20 MG tablet 90 tablet 0    Sig: TAKE ONE TABLET BY MOUTH ONCE DAILY     Psychiatry:  Antidepressants - SSRI Passed - 11/17/2023 12:42 PM      Passed - Completed PHQ-2 or PHQ-9 in the last 360 days      Passed - Valid encounter within last 6 months    Recent Outpatient Visits           2 months ago Hyperglycemia due to diabetes mellitus (HCC)   [place] [facility], MD   2 months ago Dyslipidemia   [facility] at [facility], MD               hydrochlorothiazide  (HYDRODIURIL ) 25 MG tablet 90 tablet 0    Sig: TAKE ONE TABLET BY MOUTH ONCE DAILY     Cardiovascular: Diuretics - Thiazide Passed - 11/17/2023 12:42 PM      Passed - Cr in normal range and within 180 days    Creatinine, Ser  Date  Value Ref Range Status  09/07/2023 0.79 0.76 - 1.27 mg/dL Final         Passed - K in normal range and within 180 days    Potassium  Date Value Ref Range Status  09/07/2023 4.6 3.5 - 5.2 mmol/L Final         Passed - Na in normal range and within 180 days    Sodium  Date Value Ref Range Status  09/07/2023 135 134 - 144 mmol/L Final         Passed - Last BP in normal range    BP Readings from Last 1 Encounters:   09/07/23 102/70         Passed - Valid encounter within last 6 months    Recent Outpatient Visits           2 months ago Hyperglycemia due to diabetes mellitus (HCC)   [place] [facility], MD   2 months ago Dyslipidemia   [facility], MD

## 2024-02-08 ENCOUNTER — Other Ambulatory Visit: Payer: Self-pay | Admitting: Family Medicine

## 2024-02-08 DIAGNOSIS — F419 Anxiety disorder, unspecified: Secondary | ICD-10-CM

## 2024-02-08 DIAGNOSIS — E785 Hyperlipidemia, unspecified: Secondary | ICD-10-CM

## 2024-02-08 DIAGNOSIS — I10 Essential (primary) hypertension: Secondary | ICD-10-CM

## 2024-02-09 NOTE — Telephone Encounter (Signed)
 Requested medications are due for refill today.  yes  Requested medications are on the active medications list.  yes  Last refill. 11/17/2023 with 90 day supply for all 3  Future visit scheduled.   no  Notes to clinic.  Dr. Joshua listed as pcp.    Requested Prescriptions  Pending Prescriptions Disp Refills   hydrochlorothiazide  (HYDRODIURIL ) 25 MG tablet [Pharmacy Med Name: hydrochlorothiazide  25 mg tablet] 90 tablet 0    Sig: TAKE ONE TABLET BY MOUTH ONCE DAILY     Cardiovascular: Diuretics - Thiazide Passed - 02/09/2024  3:40 PM      Passed - Cr in normal range and within 180 days    Creatinine, Ser  Date Value Ref Range Status  09/07/2023 0.79 0.76 - 1.27 mg/dL Final         Passed - K in normal range and within 180 days    Potassium  Date Value Ref Range Status  09/07/2023 4.6 3.5 - 5.2 mmol/L Final         Passed - Na in normal range and within 180 days    Sodium  Date Value Ref Range Status  09/07/2023 135 134 - 144 mmol/L Final         Passed - Last BP in normal range    BP Readings from Last 1 Encounters:  09/07/23 102/70         Passed - Valid encounter within last 6 months    Recent Outpatient Visits           5 months ago Hyperglycemia due to diabetes mellitus (HCC)   [place] [facility], MD   5 months ago Dyslipidemia   [place] [facility] at [facility], MD               atorvastatin  (LIPITOR) 10 MG tablet [Pharmacy Med Name: atorvastatin  10 mg tablet] 90 tablet 0    Sig: TAKE ONE TABLET BY MOUTH ONCE DAILY     Cardiovascular:  Antilipid - Statins Failed - 02/09/2024  3:40 PM      Failed - Lipid Panel in normal range within the last 12 months    Cholesterol, Total  Date Value Ref Range Status  09/03/2023 173 100 - 199 mg/dL Final   LDL Chol Calc (NIH)  Date Value Ref Range Status  09/03/2023 100 (H) 0 - 99 mg/dL Final   HDL  Date  Value Ref Range Status  09/03/2023 37 (L) >39 mg/dL Final   Triglycerides  Date Value Ref Range Status  09/03/2023 210 (H) 0 - 149 mg/dL Final         Passed - Patient is not pregnant      Passed - Valid encounter within last 12 months    Recent Outpatient Visits           5 months ago Hyperglycemia due to diabetes mellitus (HCC)    [facility], MD   5 months ago Dyslipidemia   [facility], Deanna C, MD               lisinopril  (ZESTRIL ) 30 MG tablet [Pharmacy Med Name: lisinopril  30 mg tablet] 90 tablet 0    Sig: TAKE ONE TABLET BY MOUTH ONCE DAILY     Cardiovascular:  ACE Inhibitors Passed - 02/09/2024  3:40 PM      Passed - Cr in normal range and within 180 days    Creatinine, Ser  Date Value Ref Range Status  09/07/2023 0.79 0.76 - 1.27 mg/dL Final         Passed - K in normal range and within 180 days    Potassium  Date Value Ref Range Status  09/07/2023 4.6 3.5 - 5.2 mmol/L Final         Passed - Patient is not pregnant      Passed - Last BP in normal range    BP Readings from Last 1 Encounters:  09/07/23 102/70         Passed - Valid encounter within last 6 months    Recent Outpatient Visits           5 months ago Hyperglycemia due to diabetes mellitus (HCC)   [place] [facility], MD   5 months ago Dyslipidemia   [facility] at [facility], MD               escitalopram  (LEXAPRO ) 20 MG tablet [Pharmacy Med Name: escitalopram  20 mg tablet] 90 tablet 0    Sig: TAKE ONE TABLET BY MOUTH ONCE DAILY     Psychiatry:  Antidepressants - SSRI Passed - 02/09/2024  3:40 PM      Passed - Completed PHQ-2 or PHQ-9 in the last 360 days      Passed - Valid encounter within last 6 months    Recent Outpatient Visits            5 months ago Hyperglycemia due to diabetes mellitus (HCC)   [place] [facility], MD   5 months ago Dyslipidemia   [facility], MD

## 2024-02-19 ENCOUNTER — Encounter: Admitting: Family Medicine

## 2024-02-22 ENCOUNTER — Other Ambulatory Visit: Payer: Self-pay | Admitting: Family Medicine

## 2024-02-22 DIAGNOSIS — F32A Depression, unspecified: Secondary | ICD-10-CM

## 2024-02-22 DIAGNOSIS — E785 Hyperlipidemia, unspecified: Secondary | ICD-10-CM

## 2024-02-22 DIAGNOSIS — I10 Essential (primary) hypertension: Secondary | ICD-10-CM

## 2024-02-23 NOTE — Telephone Encounter (Signed)
 Okay to refill?

## 2024-02-23 NOTE — Telephone Encounter (Signed)
 Requested medication (s) are due for refill today - yes  Requested medication (s) are on the active medication list -yes  Future visit scheduled -no  Last refill: 02/10/24 #15  Notes to clinic: Patient has NP appointment 03/02/24(outside office)- can he have bridge Rx to that appointment?  Requested Prescriptions  Pending Prescriptions Disp Refills   atorvastatin  (LIPITOR) 10 MG tablet [Pharmacy Med Name: atorvastatin  10 mg tablet] 15 tablet 0    Sig: TAKE ONE TABLET BY MOUTH ONCE DAILY     Cardiovascular:  Antilipid - Statins Failed - 02/23/2024  2:57 PM      Failed - Lipid Panel in normal range within the last 12 months    Cholesterol, Total  Date Value Ref Range Status  09/03/2023 173 100 - 199 mg/dL Final   LDL Chol Calc (NIH)  Date Value Ref Range Status  09/03/2023 100 (H) 0 - 99 mg/dL Final   HDL  Date Value Ref Range Status  09/03/2023 37 (L) >39 mg/dL Final   Triglycerides  Date Value Ref Range Status  09/03/2023 210 (H) 0 - 149 mg/dL Final         Passed - Patient is not pregnant      Passed - Valid encounter within last 12 months    Recent Outpatient Visits           5 months ago Hyperglycemia due to diabetes mellitus (HCC)   [place] [facility], MD   5 months ago Dyslipidemia   [place] [facility], MD               hydrochlorothiazide  (HYDRODIURIL ) 25 MG tablet [Pharmacy Med Name: hydrochlorothiazide  25 mg tablet] 15 tablet 0    Sig: TAKE ONE TABLET BY MOUTH ONCE DAILY     Cardiovascular: Diuretics - Thiazide Passed - 02/23/2024  2:57 PM      Passed - Cr in normal range and within 180 days    Creatinine, Ser  Date Value Ref Range Status  09/07/2023 0.79 0.76 - 1.27 mg/dL Final         Passed - K in normal range and within 180 days    Potassium  Date Value Ref Range Status  09/07/2023 4.6 3.5 - 5.2 mmol/L Final          Passed - Na in normal range and within 180 days    Sodium  Date Value Ref Range Status  09/07/2023 135 134 - 144 mmol/L Final         Passed - Last BP in normal range    BP Readings from Last 1 Encounters:  09/07/23 102/70         Passed - Valid encounter within last 6 months    Recent Outpatient Visits           5 months ago Hyperglycemia due to diabetes mellitus (HCC)   [place] [facility], MD   5 months ago Dyslipidemia   [facility], Deanna C, MD               escitalopram  (LEXAPRO ) 20 MG tablet [Pharmacy Med Name: escitalopram  20 mg tablet] 15 tablet 0    Sig: TAKE ONE TABLET BY MOUTH ONCE DAILY     Psychiatry:  Antidepressants - SSRI Passed - 02/23/2024  2:57 PM      Passed - Completed PHQ-2 or PHQ-9 in the last 360 days      Passed - Valid encounter within last 6 months    Recent Outpatient Visits           5 months ago Hyperglycemia due to diabetes mellitus (HCC)   [place] [facility], MD   5 months ago Dyslipidemia   [facility], MD               lisinopril  (ZESTRIL ) 30 MG tablet [Pharmacy Med Name: lisinopril  30 mg tablet] 15 tablet 0    Sig: TAKE ONE TABLET BY MOUTH ONCE DAILY     Cardiovascular:  ACE Inhibitors Passed - 02/23/2024  2:57 PM      Passed - Cr in normal range and within 180 days    Creatinine, Ser  Date Value Ref Range Status  09/07/2023 0.79 0.76 - 1.27 mg/dL Final         Passed - K in normal range and within 180 days    Potassium  Date Value Ref Range Status  09/07/2023 4.6 3.5 - 5.2 mmol/L Final         Passed - Patient is not pregnant      Passed - Last BP in normal range    BP Readings from Last 1 Encounters:  09/07/23 102/70         Passed - Valid encounter within last 6 months     Recent Outpatient Visits           5 months ago Hyperglycemia due to diabetes mellitus (HCC)   [place] [facility], MD   5 months ago Dyslipidemia   [facility], MD                 Requested Prescriptions  Pending Prescriptions Disp Refills   atorvastatin  (LIPITOR) 10 MG tablet [Pharmacy Med Name: atorvastatin  10 mg tablet] 15 tablet 0    Sig: TAKE ONE TABLET BY MOUTH ONCE DAILY     Cardiovascular:  Antilipid - Statins Failed - 02/23/2024  2:57 PM      Failed - Lipid Panel in normal range within the last 12 months    Cholesterol, Total  Date Value Ref Range Status  09/03/2023 173 100 - 199 mg/dL Final   LDL Chol Calc (NIH)  Date Value Ref Range Status  09/03/2023 100 (H) 0 - 99 mg/dL Final   HDL  Date Value Ref Range Status  09/03/2023 37 (L) >39 mg/dL Final   Triglycerides  Date Value Ref Range Status  09/03/2023 210 (H) 0 - 149 mg/dL Final         Passed - Patient is not pregnant      Passed - Valid encounter within last 12 months    Recent Outpatient Visits           5 months ago Hyperglycemia due to diabetes mellitus (HCC)    [facility], MD   5 months ago Dyslipidemia   [facility], MD               hydrochlorothiazide  (HYDRODIURIL ) 25  MG tablet [Pharmacy Med Name: hydrochlorothiazide  25 mg tablet] 15 tablet 0    Sig: TAKE ONE TABLET BY MOUTH ONCE DAILY     Cardiovascular: Diuretics - Thiazide Passed - 02/23/2024  2:57 PM      Passed - Cr in normal range and within 180 days    Creatinine, Ser  Date Value Ref Range Status  09/07/2023 0.79 0.76 - 1.27 mg/dL Final         Passed - K in normal range and within 180 days    Potassium  Date Value Ref Range Status  09/07/2023 4.6 3.5 - 5.2  mmol/L Final         Passed - Na in normal range and within 180 days    Sodium  Date Value Ref Range Status  09/07/2023 135 134 - 144 mmol/L Final         Passed - Last BP in normal range    BP Readings from Last 1 Encounters:  09/07/23 102/70         Passed - Valid encounter within last 6 months    Recent Outpatient Visits           5 months ago Hyperglycemia due to diabetes mellitus (HCC)   [place] [facility], MD   5 months ago Dyslipidemia   [facility], MD               escitalopram  (LEXAPRO ) 20 MG tablet [Pharmacy Med Name: escitalopram  20 mg tablet] 15 tablet 0    Sig: TAKE ONE TABLET BY MOUTH ONCE DAILY     Psychiatry:  Antidepressants - SSRI Passed - 02/23/2024  2:57 PM      Passed - Completed PHQ-2 or PHQ-9 in the last 360 days      Passed - Valid encounter within last 6 months    Recent Outpatient Visits           5 months ago Hyperglycemia due to diabetes mellitus (HCC)   [place] [facility], MD   5 months ago Dyslipidemia   [place] [facility], Deanna C, MD               lisinopril  (ZESTRIL ) 30 MG tablet [Pharmacy Med Name: lisinopril  30 mg tablet] 15 tablet 0    Sig: TAKE ONE TABLET BY MOUTH ONCE DAILY     Cardiovascular:  ACE Inhibitors Passed - 02/23/2024  2:57 PM      Passed - Cr in normal range and within 180 days    Creatinine, Ser  Date Value Ref Range Status  09/07/2023 0.79 0.76 - 1.27 mg/dL Final         Passed - K in normal range and within 180 days    Potassium  Date Value Ref Range Status  09/07/2023 4.6 3.5 - 5.2 mmol/L Final         Passed - Patient is not pregnant      Passed - Last BP in normal range    BP Readings from Last 1 Encounters:  09/07/23 102/70         Passed - Valid  encounter within last 6 months    Recent Outpatient Visits           5 months ago Hyperglycemia due to diabetes mellitus (  [facility])   [place] [facility], MD   5 months ago Dyslipidemia   [facility], Deanna C, MD

## 2024-02-24 ENCOUNTER — Other Ambulatory Visit: Payer: Self-pay

## 2024-02-24 DIAGNOSIS — M654 Radial styloid tenosynovitis [de Quervain]: Secondary | ICD-10-CM

## 2024-02-24 DIAGNOSIS — F32A Depression, unspecified: Secondary | ICD-10-CM

## 2024-02-24 DIAGNOSIS — E785 Hyperlipidemia, unspecified: Secondary | ICD-10-CM

## 2024-02-24 DIAGNOSIS — E0869 Diabetes mellitus due to underlying condition with other specified complication: Secondary | ICD-10-CM

## 2024-02-24 DIAGNOSIS — M19042 Primary osteoarthritis, left hand: Secondary | ICD-10-CM

## 2024-02-24 DIAGNOSIS — I10 Essential (primary) hypertension: Secondary | ICD-10-CM

## 2024-02-24 MED ORDER — METFORMIN HCL 500 MG PO TABS: 500.0000 mg | ORAL_TABLET | Freq: Two times a day (BID) | ORAL | 0 refills | Status: AC

## 2024-02-24 MED ORDER — HYDROCHLOROTHIAZIDE 25 MG PO TABS
25.0000 mg | ORAL_TABLET | Freq: Every day | ORAL | 0 refills | Status: AC
Start: 2024-02-24 — End: ?

## 2024-02-24 MED ORDER — LISINOPRIL 30 MG PO TABS
30.0000 mg | ORAL_TABLET | Freq: Every day | ORAL | 0 refills | Status: AC
Start: 2024-02-24 — End: ?

## 2024-02-24 MED ORDER — IBUPROFEN 800 MG PO TABS
800.0000 mg | ORAL_TABLET | Freq: Three times a day (TID) | ORAL | 0 refills | Status: AC | PRN
Start: 2024-02-24 — End: ?

## 2024-02-24 MED ORDER — ATORVASTATIN CALCIUM 10 MG PO TABS: 10.0000 mg | ORAL_TABLET | Freq: Every day | ORAL | 0 refills | Status: AC

## 2024-02-24 MED ORDER — ESCITALOPRAM OXALATE 20 MG PO TABS: 20.0000 mg | ORAL_TABLET | Freq: Every day | ORAL | 0 refills | Status: AC
